# Patient Record
Sex: Female | Born: 1970 | Race: White | Hispanic: No | Marital: Married | Smoking: Former smoker
Health system: Southern US, Community
[De-identification: ages and names within clinical notes are randomized; demographics above are authoritative.]

## PROBLEM LIST (undated history)

## (undated) DIAGNOSIS — F32A Depression, unspecified: Secondary | ICD-10-CM

## (undated) DIAGNOSIS — C4490 Unspecified malignant neoplasm of skin, unspecified: Secondary | ICD-10-CM

## (undated) DIAGNOSIS — F419 Anxiety disorder, unspecified: Secondary | ICD-10-CM

## (undated) DIAGNOSIS — C50919 Malignant neoplasm of unspecified site of unspecified female breast: Secondary | ICD-10-CM

## (undated) DIAGNOSIS — Z803 Family history of malignant neoplasm of breast: Secondary | ICD-10-CM

## (undated) DIAGNOSIS — Z923 Personal history of irradiation: Secondary | ICD-10-CM

## (undated) DIAGNOSIS — J45909 Unspecified asthma, uncomplicated: Secondary | ICD-10-CM

## (undated) DIAGNOSIS — M1990 Unspecified osteoarthritis, unspecified site: Secondary | ICD-10-CM

## (undated) DIAGNOSIS — Z801 Family history of malignant neoplasm of trachea, bronchus and lung: Secondary | ICD-10-CM

## (undated) DIAGNOSIS — Z8041 Family history of malignant neoplasm of ovary: Secondary | ICD-10-CM

## (undated) DIAGNOSIS — Z806 Family history of leukemia: Secondary | ICD-10-CM

## (undated) DIAGNOSIS — D649 Anemia, unspecified: Secondary | ICD-10-CM

## (undated) DIAGNOSIS — Z808 Family history of malignant neoplasm of other organs or systems: Secondary | ICD-10-CM

## (undated) HISTORY — PX: TUBAL LIGATION: SHX77

## (undated) HISTORY — DX: Anemia, unspecified: D64.9

## (undated) HISTORY — DX: Family history of malignant neoplasm of trachea, bronchus and lung: Z80.1

## (undated) HISTORY — PX: OTHER SURGICAL HISTORY: SHX169

## (undated) HISTORY — DX: Anxiety disorder, unspecified: F41.9

## (undated) HISTORY — DX: Family history of malignant neoplasm of breast: Z80.3

## (undated) HISTORY — DX: Family history of malignant neoplasm of other organs or systems: Z80.8

## (undated) HISTORY — DX: Family history of leukemia: Z80.6

## (undated) HISTORY — PX: COLONOSCOPY: SHX174

## (undated) HISTORY — DX: Family history of malignant neoplasm of ovary: Z80.41

---

## 1999-07-17 HISTORY — PX: LUMBAR MICRODISCECTOMY: SHX99

## 2003-04-23 ENCOUNTER — Encounter: Admission: RE | Admit: 2003-04-23 | Discharge: 2003-04-23 | Payer: Self-pay | Admitting: Family Medicine

## 2003-04-23 ENCOUNTER — Encounter: Payer: Self-pay | Admitting: Family Medicine

## 2003-06-28 ENCOUNTER — Encounter: Admission: RE | Admit: 2003-06-28 | Discharge: 2003-06-28 | Payer: Self-pay | Admitting: Family Medicine

## 2003-12-23 ENCOUNTER — Other Ambulatory Visit: Admission: RE | Admit: 2003-12-23 | Discharge: 2003-12-23 | Payer: Self-pay | Admitting: *Deleted

## 2004-06-27 ENCOUNTER — Emergency Department (HOSPITAL_COMMUNITY): Admission: EM | Admit: 2004-06-27 | Discharge: 2004-06-27 | Payer: Self-pay | Admitting: Emergency Medicine

## 2006-01-02 ENCOUNTER — Other Ambulatory Visit: Admission: RE | Admit: 2006-01-02 | Discharge: 2006-01-02 | Payer: Self-pay | Admitting: Obstetrics and Gynecology

## 2006-07-28 ENCOUNTER — Inpatient Hospital Stay (HOSPITAL_COMMUNITY): Admission: AD | Admit: 2006-07-28 | Discharge: 2006-07-28 | Payer: Self-pay | Admitting: Obstetrics and Gynecology

## 2006-07-30 ENCOUNTER — Inpatient Hospital Stay (HOSPITAL_COMMUNITY): Admission: AD | Admit: 2006-07-30 | Discharge: 2006-08-03 | Payer: Self-pay | Admitting: Obstetrics and Gynecology

## 2006-07-31 ENCOUNTER — Encounter (INDEPENDENT_AMBULATORY_CARE_PROVIDER_SITE_OTHER): Payer: Self-pay | Admitting: *Deleted

## 2007-05-27 ENCOUNTER — Other Ambulatory Visit: Admission: RE | Admit: 2007-05-27 | Discharge: 2007-05-27 | Payer: Self-pay | Admitting: Obstetrics and Gynecology

## 2008-06-02 ENCOUNTER — Other Ambulatory Visit: Admission: RE | Admit: 2008-06-02 | Discharge: 2008-06-02 | Payer: Self-pay | Admitting: Obstetrics and Gynecology

## 2008-07-13 ENCOUNTER — Inpatient Hospital Stay (HOSPITAL_COMMUNITY): Admission: AD | Admit: 2008-07-13 | Discharge: 2008-07-13 | Payer: Self-pay | Admitting: Obstetrics and Gynecology

## 2008-07-15 ENCOUNTER — Inpatient Hospital Stay (HOSPITAL_COMMUNITY): Admission: AD | Admit: 2008-07-15 | Discharge: 2008-07-15 | Payer: Self-pay | Admitting: Obstetrics and Gynecology

## 2008-07-19 ENCOUNTER — Inpatient Hospital Stay (HOSPITAL_COMMUNITY): Admission: AD | Admit: 2008-07-19 | Discharge: 2008-07-19 | Payer: Self-pay | Admitting: Obstetrics and Gynecology

## 2009-06-08 ENCOUNTER — Ambulatory Visit (HOSPITAL_COMMUNITY): Admission: RE | Admit: 2009-06-08 | Discharge: 2009-06-08 | Payer: Self-pay | Admitting: Obstetrics and Gynecology

## 2009-07-18 ENCOUNTER — Encounter (INDEPENDENT_AMBULATORY_CARE_PROVIDER_SITE_OTHER): Payer: Self-pay | Admitting: Obstetrics and Gynecology

## 2009-07-18 ENCOUNTER — Inpatient Hospital Stay (HOSPITAL_COMMUNITY): Admission: RE | Admit: 2009-07-18 | Discharge: 2009-07-20 | Payer: Self-pay | Admitting: Obstetrics and Gynecology

## 2009-10-25 ENCOUNTER — Other Ambulatory Visit: Admission: RE | Admit: 2009-10-25 | Discharge: 2009-10-25 | Payer: Self-pay | Admitting: Obstetrics and Gynecology

## 2010-10-01 LAB — GLUCOSE, CAPILLARY
Glucose-Capillary: 69 mg/dL — ABNORMAL LOW (ref 70–99)
Glucose-Capillary: 75 mg/dL (ref 70–99)

## 2010-10-01 LAB — URINALYSIS, ROUTINE W REFLEX MICROSCOPIC
Glucose, UA: NEGATIVE mg/dL
Hgb urine dipstick: NEGATIVE
Protein, ur: NEGATIVE mg/dL

## 2010-10-01 LAB — CBC
HCT: 23.8 % — ABNORMAL LOW (ref 36.0–46.0)
HCT: 31.3 % — ABNORMAL LOW (ref 36.0–46.0)
Hemoglobin: 10.2 g/dL — ABNORMAL LOW (ref 12.0–15.0)
MCHC: 32.7 g/dL (ref 30.0–36.0)
Platelets: 176 10*3/uL (ref 150–400)
RDW: 18.7 % — ABNORMAL HIGH (ref 11.5–15.5)
WBC: 8.4 10*3/uL (ref 4.0–10.5)

## 2010-10-01 LAB — CCBB MATERNAL DONOR DRAW

## 2010-10-30 LAB — HCG, QUANTITATIVE, PREGNANCY: hCG, Beta Chain, Quant, S: 18 m[IU]/mL — ABNORMAL HIGH (ref <5)

## 2010-12-01 NOTE — Op Note (Signed)
NA[place]land Kitchen  [place], [place]           ACCOUNT NO.:  [id number]   MEDICAL RECORD NO.:  [id number]          PATIENT TYPE:  OBV   LOCATION:  9163                          FACILITY:  [facility]   PHYSICIAN:  Gerald Leitz, MD          DATE OF BIRTH:  Dec 26, 1970   DATE OF PROCEDURE:  07/31/2006  DATE OF DISCHARGE:                               OPERATIVE REPORT   PREOPERATIVE DIAGNOSIS:  A 40-2/7-week intrauterine pregnancy with  failure to progress and fetal tachycardia.   POSTOPERATIVE DIAGNOSIS:  A 40-2/7-week intrauterine pregnancy with  failure to progress and fetal tachycardia.   PROCEDURE:  Primary low transverse cesarean section.   SURGEON:  Gerald Leitz, MD   ASSISTANT:  None.   ANESTHESIA:  Epidural.   SPECIMENS:  Placenta.   DISPOSITION OF SPECIMEN:  Sent to pathology.   ESTIMATED BLOOD LOSS:  700 mL.   FLUIDS:  2100 mL of lactated Ringer's.   URINE OUTPUT:  100 mL.   COMPLICATIONS:  None.   FINDINGS:  Female infant in cephalic presentation with Apgars of 8 and 9  at 1 and 5 minutes, respectively, arterial cord pH of 7.27, weight 7  pounds 9 ounces.   INDICATIONS:  This is a G1, P0 at 40-2/7-week intrauterine pregnancy  with failure to progress with maximal cervical dilatation of 8 cm and  fetal tachycardia.   PROCEDURE:  Risks, benefits and alternatives of the procedure were  explained to the patient.  Informed consent was obtained.  The patient  was taken to the operating room, where her epidural anesthesia was found  to be adequate.  She was prepped and draped in the usual sterile  fashion.  A Pfannenstiel skin incision was made with the scalpel and it  was carried down to the underlying layer of fascia, which was incised in  the midline.  The incision was extended laterally with Mayo scissors.  The superior aspect of the fascial incision was grasped with Kocher  clamps, elevated and the underlying rectus muscles were dissected off  with Mayo scissors and this was  repeated on the inferior aspect of the  fascial incision.  The rectus muscles were separated in the midline.  The peritoneum was identified and entered bluntly.  Bladder blade was  inserted.  The vesicouterine peritoneum was identified, tented up and  entered sharply with Metzenbaum scissors.  The incision was extended  laterally.  The bladder flap was created digitally.  The bladder blade  was then replaced.  A low-transverse uterine incision was made with a  scalpel.  The infant's head was delivered; the mouth and nose were bulb  suctioned.  The anterior shoulder and body was delivered.  Cord was  clamped x2 and cut.  The infant was handed off to the awaiting  neonatologist.  Placenta was expressed.  The uterus was exteriorized and  cleared of all clot and debris.  The uterine incision closed with 0  Vicryl in a running-locked fashion; a second layer of the same suture  was used for excellent hemostasis.  The uterus was returned to the  abdomen.  The abdomen  was copiously irrigated.  The uterine incision was  found to be hemostatic.  The fascia was reapproximated with 0 PDS.  Scarpa's fascia was reapproximated with interrupted 2-0 Vicryl.  The  skin was closed with staples.  Sponge, lap and needle counts were  correct x2.  Two grams of Ancef were given at cord clamp.  The patient  was transferred to the recovery room, awake and in stable condition.      Gerald Leitz, MD  Electronically Signed     TC/MEDQ  D:  07/31/2006  T:  07/31/2006  Job:  [id number]

## 2010-12-01 NOTE — Discharge Summary (Signed)
[name], [place]           ACCOUNT NO.:  [id number]   MEDICAL RECORD NO.:  [id number]          PATIENT TYPE:  INP   LOCATION:  9113                          FACILITY:  [facility]   PHYSICIAN:  Gerald Leitz, MD          DATE OF BIRTH:  Oct 05, 1970   DATE OF ADMISSION:  07/29/2006  DATE OF DISCHARGE:  08/03/2006                               DISCHARGE SUMMARY   ADMISSION DIAGNOSES:  1. Forty week intrauterine pregnancy.  2. Active labor.  3. Asthma.   DISCHARGE DIAGNOSES:  1. Forty week and one day intrauterine pregnancy.  2. Active labor.  3. Asthma.  4. Failure to progress.  5. Anemia.   BRIEF HOSPITAL COURSE:  The patient was admitted on July 29, 2006,  for observation.  She was found to be in active labor and formally  admitted on July 30, 2006.  She received Pitocin for augmentation of  labor, achieved a maximal cervical dilatation of 8 cm, developed failure  to progress.  She underwent low transverse cesarean section on July 31, 2006.  She delivered a liveborn female infant, cephalic  presentation, with Apgar's of 8 and 9 at one and five minutes  respectively.  Arterial cord pH was 7.27.  Weight was 7 pounds, 9  ounces.  The patient did well postoperatively.  Hemoglobin on postop day  #1 was 8.2, hematocrit of 24.4.  She received routine postoperative  care, discharged home on August 02, 2006.   CONDITION AT DISCHARGE:  Stable and improved.   FOLLOWUP:  Dr. Richardson Dopp on January 21st for staple removal, and in four to  six weeks for postoperative visit.   MEDICATIONS AT DISCHARGE:  Percocet and Motrin as well as iron sulfate.   ACTIVITY:  Ad lib, pelvic rest for six weeks, no heavy lifting for four  weeks, no driving for two weeks.      Gerald Leitz, MD  Electronically Signed     TC/MEDQ  D:  08/13/2006  T:  08/13/2006  Job:  [id number]

## 2011-04-20 LAB — HCG, QUANTITATIVE, PREGNANCY
hCG, Beta Chain, Quant, S: 133 m[IU]/mL — ABNORMAL HIGH (ref <5)
hCG, Beta Chain, Quant, S: 69 m[IU]/mL — ABNORMAL HIGH (ref <5)

## 2011-04-20 LAB — CBC
HCT: 31.9 % — ABNORMAL LOW (ref 36.0–46.0)
Platelets: 280 10*3/uL (ref 150–400)
WBC: 8.5 10*3/uL (ref 4.0–10.5)

## 2011-04-20 LAB — ABO/RH: ABO/RH(D): A POS

## 2011-05-18 ENCOUNTER — Other Ambulatory Visit: Payer: Self-pay

## 2011-05-18 ENCOUNTER — Other Ambulatory Visit (HOSPITAL_COMMUNITY)
Admission: RE | Admit: 2011-05-18 | Discharge: 2011-05-18 | Disposition: A | Discharge status: Home / Self Care | Payer: BC Managed Care – PPO | Source: Ambulatory Visit | Attending: Family Medicine | Admitting: Family Medicine

## 2011-05-18 DIAGNOSIS — Z01419 Encounter for gynecological examination (general) (routine) without abnormal findings: Secondary | ICD-10-CM | POA: Insufficient documentation

## 2011-11-02 ENCOUNTER — Telehealth: Payer: Self-pay | Admitting: Oncology

## 2011-11-02 NOTE — Telephone Encounter (Signed)
S/w pt today re appt for 4/30 @ 10:30 am w/FS. Date per pt due to out of town.

## 2011-11-13 ENCOUNTER — Ambulatory Visit: Payer: BC Managed Care – PPO | Admitting: Oncology

## 2011-11-13 ENCOUNTER — Ambulatory Visit: Payer: BC Managed Care – PPO

## 2011-11-13 ENCOUNTER — Other Ambulatory Visit: Payer: BC Managed Care – PPO | Admitting: Lab

## 2011-11-13 ENCOUNTER — Telehealth: Payer: Self-pay | Admitting: Oncology

## 2011-11-13 ENCOUNTER — Other Ambulatory Visit: Payer: Self-pay | Admitting: Oncology

## 2011-11-13 DIAGNOSIS — D649 Anemia, unspecified: Secondary | ICD-10-CM

## 2011-11-13 NOTE — Telephone Encounter (Signed)
Returned pt's call re r/s 4/30 appt. Pt given new appt for 5/8.

## 2011-11-21 ENCOUNTER — Other Ambulatory Visit: Payer: Self-pay | Admitting: *Deleted

## 2011-11-21 ENCOUNTER — Encounter: Payer: Self-pay | Admitting: Oncology

## 2011-11-21 ENCOUNTER — Ambulatory Visit (HOSPITAL_BASED_OUTPATIENT_CLINIC_OR_DEPARTMENT_OTHER): Payer: BC Managed Care – PPO | Admitting: Oncology

## 2011-11-21 ENCOUNTER — Ambulatory Visit: Payer: BC Managed Care – PPO

## 2011-11-21 ENCOUNTER — Telehealth: Payer: Self-pay | Admitting: Oncology

## 2011-11-21 ENCOUNTER — Other Ambulatory Visit (HOSPITAL_BASED_OUTPATIENT_CLINIC_OR_DEPARTMENT_OTHER): Payer: BC Managed Care – PPO | Admitting: Lab

## 2011-11-21 VITALS — BP 115/61 | HR 66 | Temp 97.7°F | Ht 62.25 in | Wt 150.4 lb

## 2011-11-21 DIAGNOSIS — D649 Anemia, unspecified: Secondary | ICD-10-CM

## 2011-11-21 DIAGNOSIS — R42 Dizziness and giddiness: Secondary | ICD-10-CM

## 2011-11-21 LAB — CBC WITH DIFFERENTIAL/PLATELET
BASO%: 0.8 % (ref 0.0–2.0)
EOS%: 4.3 % (ref 0.0–7.0)
MCHC: 31.1 g/dL — ABNORMAL LOW (ref 31.5–36.0)
MONO#: 0.2 10*3/uL (ref 0.1–0.9)
RBC: 4.12 10*6/uL (ref 3.70–5.45)
RDW: 15.3 % — ABNORMAL HIGH (ref 11.2–14.5)
WBC: 5.2 10*3/uL (ref 3.9–10.3)
lymph#: 1.5 10*3/uL (ref 0.9–3.3)

## 2011-11-21 LAB — COMPREHENSIVE METABOLIC PANEL
ALT: 10 U/L (ref 0–35)
AST: 17 U/L (ref 0–37)
CO2: 27 mEq/L (ref 19–32)
Calcium: 9 mg/dL (ref 8.4–10.5)
Chloride: 105 mEq/L (ref 96–112)
Sodium: 140 mEq/L (ref 135–145)
Total Protein: 6.5 g/dL (ref 6.0–8.3)

## 2011-11-21 LAB — CHCC SMEAR

## 2011-11-21 LAB — IRON AND TIBC: %SAT: 3 % — ABNORMAL LOW (ref 20–55)

## 2011-11-21 NOTE — Telephone Encounter (Signed)
Gv pt appt for sept2013 

## 2011-11-21 NOTE — Progress Notes (Signed)
Referral MD  Dr. Cain Saupe   Reason for Referral: Anemia   HPI: 41 year old native of [place] and lives in [place]. 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 She has a past medical history significant for depression and long-standing anemia. She has been told that she is anemic since her teenage days where she started having menstrual cycles. She had been on iron replacement in the past that have been quite erratic in taking. Her hemoglobin usually fluctuates between 10 and 11 however it did drop down to 7.7 during her most recent pregnancy 2 years ago. Most recently she developed an episode of acute bronchitis as well as a dizzy spell and was evaluated by her primary care physician and found to have anemia with a hemoglobin of 9.9. Her MCV was 78.4. She had normal differential and normal platelet count. That was done on April 12 and have been trying to take oral iron replacement at this time. She had some difficulty adhering to the oral regimens as well as some occasional drips of dyspepsia associated with it. She still have heavy menstrual cycles at times lasting 3-4 days. She had not reported any GI bleeding had not reported any GU bleeding had not reported any epistaxis or any blood loss anywhere else. Her energy is relatively stable she does report some mild fatigue however. She does report occasional vertigo episodes associated with positional changes.   PAST MEDICAL HISTORY: Her past medical history is significant for anemia as well as anxiety and history of an ovarian cyst     Current outpatient prescriptions:ferrous sulfate 325 (65 FE) MG tablet, Take 325 mg by mouth 2 (two) times daily., Disp: , Rfl: ;  FLUoxetine (PROZAC) 20 MG capsule, Take 20 mg by mouth Daily., Disp: , Rfl: :    :  No Known Allergies:   Family history: Her mother has history of hypertension and her father has a history of hyperlipidemia. She has 3  sisters and then just has diagnosis of anemia.          Social history: She smokes about 1 pack a week and drinks about 2-3 beers a day. She works as a Furniture conservator/restorer she currently married and lives in [place].   REVIEW OF SYSTEMS:  Constitutional: negative Eyes: negative Ears, nose, mouth, throat, and face: negative Respiratory: negative Cardiovascular: negative Genitourinary:negative Hematologic/lymphatic: negative  rest or view of system is unremarkable    PHYSICAL EXAM: BP: 115/61. P 66 RR 16 Temp 97.7 Weight 150 lb. ECOG: 0 General appearance: alert Head: Normocephalic, without obvious abnormality, atraumatic Eyes: conjunctivae/corneas clear. PERRL, EOM's intact. Fundi benign. Nose: Nares normal. Septum midline. Mucosa normal. No drainage or sinus tenderness. Throat: lips, mucosa, and tongue normal; teeth and gums normal Neck: no adenopathy, no carotid bruit, no JVD, supple, symmetrical, trachea midline and thyroid not enlarged, symmetric, no tenderness/mass/nodules Chest wall: no tenderness Cardio: regular rate and rhythm, S1, S2 normal, no murmur, click, rub or gallop Pelvic: deferred Extremities: extremities normal, atraumatic, no cyanosis or edema Skin: Skin color, texture, turgor normal. No rashes or lesions   Component Value Date   WBC 5.2 11/21/2011   HGB 10.2* 11/21/2011   HCT 32.8* 11/21/2011   MCV 79.6 11/21/2011   PLT 315 11/21/2011     Blood smear review: Red cells appeared microcytic in nature aswell as hyperchromia was noted. There is no schistocytosis or red cell fragmentation is no polychromasia  or  Spherocytosis. No other abnormalities in the white cells or platelets.   Assessment and Plan:   41 year old with:   1. Anemia: microcytic and hypochromic. This is likely related to iron deficiency due to menstrual blood losses.  She has been erratic with her po iron replacement. I have given her the option of IV iron replacement in the  form of feraheme vs continuing  Strict  adherence po iron replacement. She would like to defer IV iron for now.  Other possibility to her anemia would include hemoglobinopathy such as sickle cell or thalassemia I think it's unlikely at this point given the finding on her peripheral smear.  The plan will be at this point to give her 3-4 months of by mouth iron replacement and if it's not successful we will try IV iron.  2. Vertigo and dizziness: Etiology is a likely related to her anemia. I feel the degree of anemia here is very mild and is chronic in nature I doubt that this is a contributing factor. Other factors such as include benign positional vertigo or in her ear problems are more likely. I recommended an ENT evaluation regarding. All her questions are answered today followup as mentioned will be in 4 months time

## 2012-03-25 ENCOUNTER — Ambulatory Visit: Payer: BC Managed Care – PPO | Admitting: Oncology

## 2012-03-25 ENCOUNTER — Other Ambulatory Visit: Payer: BC Managed Care – PPO | Admitting: Lab

## 2012-10-20 ENCOUNTER — Other Ambulatory Visit: Payer: Self-pay | Admitting: Family Medicine

## 2012-10-20 ENCOUNTER — Other Ambulatory Visit (HOSPITAL_COMMUNITY)
Admission: RE | Admit: 2012-10-20 | Discharge: 2012-10-20 | Disposition: A | Discharge status: Home / Self Care | Payer: BC Managed Care – PPO | Source: Ambulatory Visit | Attending: Family Medicine | Admitting: Family Medicine

## 2012-10-20 DIAGNOSIS — Z0000 Encounter for general adult medical examination without abnormal findings: Secondary | ICD-10-CM | POA: Insufficient documentation

## 2012-10-21 ENCOUNTER — Other Ambulatory Visit: Payer: Self-pay | Admitting: Family Medicine

## 2012-10-21 DIAGNOSIS — Z1231 Encounter for screening mammogram for malignant neoplasm of breast: Secondary | ICD-10-CM

## 2012-11-19 ENCOUNTER — Ambulatory Visit
Admission: RE | Admit: 2012-11-19 | Discharge: 2012-11-19 | Disposition: A | Discharge status: Home / Self Care | Payer: BC Managed Care – PPO | Source: Ambulatory Visit | Attending: Family Medicine | Admitting: Family Medicine

## 2012-11-19 ENCOUNTER — Other Ambulatory Visit: Payer: Self-pay | Admitting: Family Medicine

## 2012-11-19 DIAGNOSIS — Z1231 Encounter for screening mammogram for malignant neoplasm of breast: Secondary | ICD-10-CM

## 2012-11-19 DIAGNOSIS — R928 Other abnormal and inconclusive findings on diagnostic imaging of breast: Secondary | ICD-10-CM

## 2012-12-04 ENCOUNTER — Ambulatory Visit
Admission: RE | Admit: 2012-12-04 | Discharge: 2012-12-04 | Disposition: A | Discharge status: Home / Self Care | Payer: BC Managed Care – PPO | Source: Ambulatory Visit | Attending: Family Medicine | Admitting: Family Medicine

## 2012-12-04 DIAGNOSIS — R928 Other abnormal and inconclusive findings on diagnostic imaging of breast: Secondary | ICD-10-CM

## 2014-04-02 ENCOUNTER — Other Ambulatory Visit: Payer: Self-pay | Admitting: Family Medicine

## 2014-04-02 ENCOUNTER — Other Ambulatory Visit (HOSPITAL_COMMUNITY)
Admission: RE | Admit: 2014-04-02 | Discharge: 2014-04-02 | Disposition: A | Discharge status: Home / Self Care | Payer: BC Managed Care – PPO | Source: Ambulatory Visit | Attending: Family Medicine | Admitting: Family Medicine

## 2014-04-02 DIAGNOSIS — Z0000 Encounter for general adult medical examination without abnormal findings: Secondary | ICD-10-CM | POA: Insufficient documentation

## 2014-04-06 LAB — CYTOLOGY - PAP

## 2016-02-10 ENCOUNTER — Other Ambulatory Visit: Payer: Self-pay | Admitting: Family Medicine

## 2016-02-10 DIAGNOSIS — Z1231 Encounter for screening mammogram for malignant neoplasm of breast: Secondary | ICD-10-CM

## 2016-02-21 ENCOUNTER — Ambulatory Visit
Admission: RE | Admit: 2016-02-21 | Discharge: 2016-02-21 | Disposition: A | Discharge status: Home / Self Care | Payer: BC Managed Care – PPO | Source: Ambulatory Visit | Attending: Family Medicine | Admitting: Family Medicine

## 2016-02-21 DIAGNOSIS — Z1231 Encounter for screening mammogram for malignant neoplasm of breast: Secondary | ICD-10-CM

## 2016-02-22 ENCOUNTER — Other Ambulatory Visit: Payer: Self-pay | Admitting: Family Medicine

## 2016-02-22 DIAGNOSIS — R928 Other abnormal and inconclusive findings on diagnostic imaging of breast: Secondary | ICD-10-CM

## 2016-02-27 ENCOUNTER — Ambulatory Visit
Admission: RE | Admit: 2016-02-27 | Discharge: 2016-02-27 | Disposition: A | Discharge status: Home / Self Care | Payer: BC Managed Care – PPO | Source: Ambulatory Visit | Attending: Family Medicine | Admitting: Family Medicine

## 2016-02-27 DIAGNOSIS — R928 Other abnormal and inconclusive findings on diagnostic imaging of breast: Secondary | ICD-10-CM

## 2016-04-06 ENCOUNTER — Other Ambulatory Visit: Payer: Self-pay | Admitting: Family Medicine

## 2016-04-06 ENCOUNTER — Other Ambulatory Visit (HOSPITAL_COMMUNITY)
Admission: RE | Admit: 2016-04-06 | Discharge: 2016-04-06 | Disposition: A | Discharge status: Home / Self Care | Payer: BC Managed Care – PPO | Source: Ambulatory Visit | Attending: Family Medicine | Admitting: Family Medicine

## 2016-04-06 DIAGNOSIS — Z01419 Encounter for gynecological examination (general) (routine) without abnormal findings: Secondary | ICD-10-CM | POA: Insufficient documentation

## 2016-04-10 LAB — CYTOLOGY - PAP

## 2017-05-28 ENCOUNTER — Other Ambulatory Visit: Payer: Self-pay | Admitting: Physician Assistant

## 2017-05-28 DIAGNOSIS — Z139 Encounter for screening, unspecified: Secondary | ICD-10-CM

## 2017-07-16 HISTORY — PX: BREAST LUMPECTOMY: SHX2

## 2017-07-16 HISTORY — PX: BREAST BIOPSY: SHX20

## 2018-01-29 ENCOUNTER — Ambulatory Visit
Admission: RE | Admit: 2018-01-29 | Discharge: 2018-01-29 | Disposition: A | Discharge status: Home / Self Care | Payer: BC Managed Care – PPO | Source: Ambulatory Visit | Attending: Physician Assistant | Admitting: Physician Assistant

## 2018-01-29 DIAGNOSIS — Z139 Encounter for screening, unspecified: Secondary | ICD-10-CM

## 2018-01-30 ENCOUNTER — Other Ambulatory Visit: Payer: Self-pay | Admitting: Physician Assistant

## 2018-01-30 DIAGNOSIS — R921 Mammographic calcification found on diagnostic imaging of breast: Secondary | ICD-10-CM

## 2018-02-04 ENCOUNTER — Other Ambulatory Visit: Payer: Self-pay | Admitting: Physician Assistant

## 2018-02-04 ENCOUNTER — Ambulatory Visit
Admission: RE | Admit: 2018-02-04 | Discharge: 2018-02-04 | Disposition: A | Discharge status: Home / Self Care | Payer: BC Managed Care – PPO | Source: Ambulatory Visit | Attending: Physician Assistant | Admitting: Physician Assistant

## 2018-02-04 DIAGNOSIS — R921 Mammographic calcification found on diagnostic imaging of breast: Secondary | ICD-10-CM

## 2018-02-07 ENCOUNTER — Ambulatory Visit
Admission: RE | Admit: 2018-02-07 | Discharge: 2018-02-07 | Disposition: A | Discharge status: Home / Self Care | Payer: BC Managed Care – PPO | Source: Ambulatory Visit | Attending: Physician Assistant | Admitting: Physician Assistant

## 2018-02-07 DIAGNOSIS — R921 Mammographic calcification found on diagnostic imaging of breast: Secondary | ICD-10-CM

## 2018-02-12 ENCOUNTER — Telehealth: Payer: Self-pay | Admitting: Oncology

## 2018-02-12 ENCOUNTER — Encounter: Payer: Self-pay | Admitting: *Deleted

## 2018-02-12 DIAGNOSIS — D0511 Intraductal carcinoma in situ of right breast: Secondary | ICD-10-CM

## 2018-02-12 NOTE — Telephone Encounter (Signed)
[facility] emailed to [facility]

## 2018-02-25 NOTE — Progress Notes (Signed)
Radiation Oncology         [phone] ________________________________  Breast Clinic Initial outpatient Consultation  Name: Ann Patton MRN: [id number]  Date: 02/26/2018  DOB: 10/06/1970  WU:[id number]:Nelson, Ann AcreKristen M, PA-C  Glenna FellowsHoxworth, Benjamin, MD   REFERRING PHYSICIAN: Glenna FellowsHoxworth, Benjamin, MD  DIAGNOSIS:    ICD-10-CM   1. Ductal carcinoma in situ (DCIS) of right breast D05.11   Cancer Staging Ductal carcinoma in situ (DCIS) of right breast Staging form: Breast, AJCC 8th Edition - Clinical stage from 02/26/2018: Stage 0 (cTis (DCIS), cN0, cM0, ER+, PR+) - Unsigned High Grade DCIS with necrosis, right breast, ER+ PR+  CHIEF COMPLAINT: Here to discuss management of right breast DCIS  HISTORY OF PRESENT ILLNESS::[name] is a 47 y.o. female who presented with right breast calcifications on mammogram.  She had a routine screening mammography on 01/29/18 showing calcifications in the right breast. She underwent bilateral diagnostic mammography with tomography and right breast ultrasonography at [facility] on 02/04/18 confirming  Suspicious right breast calcifications 14mm in span.   Accordingly on 02/07/18 she proceeded to right breast needle core biopsy with pathology from this procedure showing: mammary carcinoma in situ, high grade with necrosis; negative for invasive carcinoma. Prognostic indicators significant for: ER, 95% positive with strong staining intensity, and PR, 80% positive with moderate staining intensity.   On review of systems, patient notes fatigue and borderline anemia.  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 Patient denies and any other symptoms.   PREVIOUS RADIATION THERAPY: No  PAST MEDICAL HISTORY:  has a past medical history of Anemia and Anxiety.    PAST SURGICAL HISTORY:History reviewed. No pertinent surgical history.  FAMILY HISTORY: family history includes Breast cancer in her paternal grandmother; Hypertension in her  mother; Lung cancer in her mother; Ovarian cancer in her mother; Skin cancer in her father. Ovarian cancer in mother  SOCIAL HISTORY:  reports that she has been smoking cigarettes. She has been smoking about 0.25 packs per day. She does not have any smokeless tobacco history on file. She reports that she drinks about 2.0 standard drinks of alcohol per week. She reports that she does not use drugs.  ALLERGIES: Patient has no known allergies.  MEDICATIONS:  Current Outpatient Medications  Medication Sig Dispense Refill  . escitalopram (LEXAPRO) 10 MG tablet Take 10 mg by mouth daily.    . ferrous sulfate 325 (65 FE) MG tablet Take 325 mg by mouth 2 (two) times daily.    . Multiple Vitamin (MULTIVITAMIN) tablet Take 1 tablet by mouth daily.    . Probiotic Product (PROBIOTIC ADVANCED PO) Take 1 tablet by mouth.    . zolpidem (AMBIEN) 10 MG tablet Take 10 mg by mouth at bedtime as needed for sleep.     No current facility-administered medications for this encounter.     REVIEW OF SYSTEMS: A 10+ POINT REVIEW OF SYSTEMS WAS OBTAINED including neurology, dermatology, psychiatry, cardiac, respiratory, lymph, extremities, GI, GU, Musculoskeletal, constitutional, breasts, reproductive, HEENT.  All pertinent positives are noted in the HPI.  All others are negative.   PHYSICAL EXAM:  Vitals - 1 value per visit 02/26/2018  SYSTOLIC 116  DIASTOLIC 51  Pulse 72  Temperature 98.6  Respirations 18  Weight (lb) 140.5  Height 5' 2.25"  BMI 25.49  VISIT REPORT    General: Alert and oriented, in no acute distress HEENT: Head is normocephalic. Extraocular movements are intact. Oropharynx is clear. Neck: Neck is supple, no palpable  cervical or supraclavicular lymphadenopathy. Heart: Regular in rate and rhythm with no murmurs, rubs, or gallops. Chest: Clear to auscultation bilaterally, with no rhonchi, wheezes, or rales. Abdomen: Soft, nontender, nondistended, with no rigidity or guarding. Extremities:  No cyanosis or edema. Lymphatics: see Neck Exam Skin: No concerning lesions. Musculoskeletal: symmetric strength and muscle tone throughout. Neurologic: Cranial nerves II through XII are grossly intact. No obvious focalities. Speech is fluent. Coordination is intact. Psychiatric: Judgment and insight are intact. Affect is appropriate. Breasts: faint post biopsy changes in UIQ of right breast . No other palpable masses appreciated in the breasts or axillae .    ECOG = 0  0 - Asymptomatic (Fully active, able to carry on all predisease activities without restriction)  1 - Symptomatic but completely ambulatory (Restricted in physically strenuous activity but ambulatory and able to carry out work of a light or sedentary nature. For example, light housework, office work)  2 - Symptomatic, <50% in bed during the day (Ambulatory and capable of all self care but unable to carry out any work activities. Up and about more than 50% of waking hours)  3 - Symptomatic, >50% in bed, but not bedbound (Capable of only limited self-care, confined to bed or chair 50% or more of waking hours)  4 - Bedbound (Completely disabled. Cannot carry on any self-care. Totally confined to bed or chair)  5 - Death   Santiago Gladken MM, Creech RH, Tormey DC, et al. [phone](1982). "Toxicity and response criteria of the [facility] Cooperative Oncology Group". Am. Evlyn ClinesJ. Clin. Oncol. 5 (6): 649-55   LABORATORY DATA:  Lab Results  Component Value Date   WBC 4.3 02/26/2018   HGB 9.9 (L) 02/26/2018   HCT 32.7 (L) 02/26/2018   MCV 79.6 02/26/2018   PLT 308 02/26/2018   CMP     Component Value Date/Time   NA 140 02/26/2018 0814   K 4.3 02/26/2018 0814   CL 107 02/26/2018 0814   CO2 23 02/26/2018 0814   GLUCOSE 90 02/26/2018 0814   BUN 9 02/26/2018 0814   CREATININE 0.87 02/26/2018 0814   CALCIUM 8.6 (L) 02/26/2018 0814   PROT 6.9 02/26/2018 0814   ALBUMIN 4.0 02/26/2018 0814   AST 20 02/26/2018 0814   ALT 15 02/26/2018 0814    ALKPHOS 36 (L) 02/26/2018 0814   BILITOT <0.2 (L) 02/26/2018 0814   GFRNONAA >60 02/26/2018 0814   GFRAA >60 02/26/2018 0814         RADIOGRAPHY: Mm Digital Diagnostic Unilat R  Result Date: 02/04/2018 CLINICAL DATA:  Patient returns after screening study for evaluation of RIGHT breast calcifications. EXAM: DIGITAL DIAGNOSTIC RIGHT MAMMOGRAM WITH CAD COMPARISON:  01/29/2018 and earlier ACR Breast Density Category c: The breast tissue is heterogeneously dense, which may obscure small masses. FINDINGS: Magnified views are performed of RIGHT breast calcifications. Within the UPPER INNER QUADRANT of the RIGHT breast there is a small group of linear and punctate calcifications measuring 1.4 x 1.0 x 0.4 centimeters. Mammographic images were processed with CAD. IMPRESSION: Indeterminate RIGHT breast calcifications for which biopsy is recommended. RECOMMENDATION: Stereotactic guided core biopsy of RIGHT breast calcifications, scheduled for the patient on 02/07/2018 I have discussed the findings and recommendations with the patient. Results were also provided in writing at the conclusion of the visit. If applicable, a reminder letter will be sent to the patient regarding the next appointment. BI-RADS CATEGORY  4: Suspicious. Electronically Signed   By: Norva PavlovElizabeth  Brown Patton.D.   On: 02/04/2018 09:31  Mm 3d Screen Breast Bilateral  Result Date: 01/29/2018 CLINICAL DATA:  Screening. EXAM: DIGITAL SCREENING BILATERAL MAMMOGRAM WITH TOMO AND CAD COMPARISON:  Previous exam(s). ACR Breast Density Category c: The breast tissue is heterogeneously dense, which may obscure small masses. FINDINGS: In the right breast, calcifications warrant further evaluation with magnified views. In the left breast, no findings suspicious for malignancy. Images were processed with CAD. IMPRESSION: Further evaluation is suggested for calcifications in the right breast. RECOMMENDATION: Diagnostic mammogram of the right breast.  (Code:[id number]) The patient will be contacted regarding the findings, and additional imaging will be scheduled. BI-RADS CATEGORY  0: Incomplete. Need additional imaging evaluation and/or prior mammograms for comparison. Electronically Signed   By: Amie Portland Patton.D.   On: 01/29/2018 16:15   Mm Clip Placement Right  Result Date: 02/07/2018 CLINICAL DATA:  Confirmation clip placement after stereotactic tomosynthesis core needle biopsy of indeterminate calcifications involving the UPPER INNER QUADRANT of the RIGHT breast at anterior depth. EXAM: DIAGNOSTIC RIGHT MAMMOGRAM POST STEREOTACTIC BIOPSY COMPARISON:  Previous exam(s). FINDINGS: Mammographic images were obtained following stereotactic tomosynthesis guided biopsy of calcifications involving the UPPER INNER QUADRANT of the RIGHT breast at anterior depth. The coil shaped tissue marker clip is appropriately positioned at the posterior margin of the biopsied group of calcifications. Expected post biopsy changes are present without evidence of hematoma. IMPRESSION: Appropriate positioning the coil shaped tissue marker clip at the posterior margin of the biopsied group of calcifications in the UPPER INNER QUADRANT of the RIGHT breast. Final Assessment: Post Procedure Mammograms for Marker Placement Electronically Signed   By: Hulan Saas Patton.D.   On: 02/07/2018 11:23   Mm Rt Breast Bx W Loc Dev 1st Lesion Image Bx Spec Stereo Guide  Addendum Date: 02/11/2018   ADDENDUM REPORT: 02/11/2018 14:16 ADDENDUM: Pathology revealed HIGH GRADE MAMMARY CARCINOMA IN SITU WITH NECROSIS of the Right breast, upper inner quadrant at anterior depth. The in-situ carcinoma is positive for E-cadherin, consistent with a ductal phenotype. This was found to be concordant by Dr. Quincy Carnes. Pathology results were discussed with the patient by telephone. The patient reported doing well after the biopsy with tenderness at the site. Post biopsy instructions and care were reviewed  and questions were answered. The patient was encouraged to call [facility] for any additional concerns. The patient was referred to [facility] on February 19, 2018. Pathology results reported by Rene Kocher, RN on 02/11/2018. Electronically Signed   By: Hulan Saas Patton.D.   On: 02/11/2018 14:16   Result Date: 02/11/2018 CLINICAL DATA:  Screening detected indeterminate approximate 1.5 cm group calcifications involving the UPPER INNER QUADRANT of the RIGHT breast at ANTERIOR depth. EXAM: RIGHT BREAST STEREOTACTIC CORE NEEDLE BIOPSY COMPARISON:  Previous exams. FINDINGS: The patient and I discussed the procedure of stereotactic-guided biopsy including benefits and alternatives. We discussed the high likelihood of a successful procedure. We discussed the risks of the procedure including infection, bleeding, tissue injury, clip migration, and inadequate sampling. Informed written consent was given. The usual time out protocol was performed immediately prior to the procedure. Using sterile technique with chlorhexidine as skin antisepsis, 1% lidocaine and 1% lidocaine with epinephrine as local anesthetic, under stereotactic guidance, a 9 gauge Brevera vacuum assisted device was used to perform core needle biopsy of calcifications involving the UPPER INNER QUADRANT of the RIGHT breast using a SUPERIOR approach. Specimen radiograph was performed showing calcifications in the core  samples. Specimens with calcifications are identified for pathology. Lesion quadrant: UPPER INNER QUADRANT. At the conclusion of the procedure, a coil shaped tissue marker clip was deployed into the biopsy cavity. Follow-up 2-view mammogram was performed and dictated separately. IMPRESSION: Stereotactic-guided biopsy of an approximate 1.5 cm group of indeterminate calcifications in the UPPER INNER QUADRANT the RIGHT breast at ANTERIOR depth.  The patient had a vasovagal episode immediately after the biopsy while obtaining the post clip diagnostic mammogram. This resolved spontaneously and no intervention was necessary. The patient's blood pressure was 139/73 and her pulse rate was 59 prior to discharge. Electronically Signed: By: Hulan Saas Patton.D. On: 02/07/2018 11:22      IMPRESSION/PLAN: DCIS, right breast  Plan for breast MRI, genetic testing, and lumpectomy  She has been discussed at our multidisciplinary tumor board.  The consensus is that she would be a good candidate for breast conservation. I talked to her about the option of a mastectomy and informed her that her expected overall survival would be equivalent between mastectomy and breast conservation, based upon randomized controlled data. She is enthusiastic about breast conservation if genetic testing is negative.  It was a pleasure meeting the patient today. We discussed the risks, benefits, and side effects of radiotherapy. I recommend radiotherapy to the right breast to reduce her risk of locoregional recurrence by half.  We discussed that radiation would take approximately 4 weeks to complete and that I would give the patient a few weeks to heal following surgery before starting treatment planning.   We spoke about acute effects including skin irritation and fatigue as well as much less common late effects including internal organ injury or irritation. We spoke about the latest technology that is used to minimize the risk of late effects for patients undergoing radiotherapy to the breast or chest wall. No guarantees of treatment were given. The patient is enthusiastic about proceeding with treatment.   She will be moving to [place] soon. The plan as of now is surgery here in [place], [place] and adjuvant therapies in [place], to be navigated by our nurse navigators.  I wished her the best.  __________________________________________   Lonie Peak, MD  This document  serves as a record of services personally performed by Lonie Peak, MD. It was created on his behalf by Mickie Bail, a trained medical scribe. The creation of this record is based on the scribe's personal observations and the provider's statements to them. This document has been checked and approved by the attending provider.

## 2018-02-25 NOTE — Progress Notes (Signed)
[facility]  [phone] [phone]     ID: Ann Patton DOB: 07-05-1971  MR#: [id number]  OZD#:[id number]  Patient Care Team: Kieth Brightly as PCP - General (Physician Assistant) Excell Seltzer, MD as Consulting Physician (General Surgery) Magrinat, Virgie Dad, MD as Consulting Physician (Oncology) Eppie Gibson, MD as Attending Physician (Radiation Oncology) OTHER MD:  CHIEF COMPLAINT: Estrogen receptor positive noninvasive breast cancer  CURRENT TREATMENT: Definitive surgery pending   HISTORY OF CURRENT ILLNESS: "Ann Memos" Patton had routine screening mammography on 01/29/2018 showing a possible abnormality in the right breast. She underwent bilateral diagnostic mammography with tomography and right breast ultrasonography at [facility] on 02/04/2018 showing: Breast density category C. Indeterminate RIGHT breast calcifications within UIQ, there is a small group of linear and punctate calcifications measuring 1.4 x 1.0 x 0.4 cm.   Accordingly on 02/07/2018 she proceeded to biopsy of the right breast area in question. The pathology from this procedure showed ([id number]): Breast, right, needle core biopsy, upper inner quadrant at anterior depth with mammary carcinoma in situ, high grade with necrosis. Negative for invasive carcinoma. E-cadherin positive. Prognostic indicators significant for: estrogen receptor, 95% positive with strong staining intensity and progesterone receptor, 80%.  The patient's subsequent history is as detailed below.  INTERVAL HISTORY: The patient was evaluated in the multidisciplinary breast cancer clinic on 02/26/2018 accompanied by her husband, Merry Proud and her mother, Collie Siad. Her case was also presented at the multidisciplinary breast cancer conference on the same day. At that time a preliminary plan was proposed: Breast conserving surgery, adjuvant radiation, antiestrogens, and genetics testing   REVIEW OF  SYSTEMS: Ann Patton reports that for exercise, she typically gets on the treadmill and HIIT workouts at [employer] on average 4-5 times a week prior to her diagnosis. She has a rescue inhaler due to being allergic to cats, however, she doesn't use it often. She denies skin biopsies. There were no specific symptoms leading to the original mammogram, which was routinely scheduled. The patient denies unusual headaches, visual changes, nausea, vomiting, stiff neck, dizziness, or gait imbalance. There has been no cough, phlegm production, or pleurisy, no chest pain or pressure, and no change in bowel or bladder habits. The patient denies fever, rash, bleeding, unexplained fatigue or unexplained weight loss. A detailed review of systems was otherwise entirely negative.   PAST MEDICAL HISTORY: Past Medical History:  Diagnosis Date  . Anemia   . Anxiety     PAST SURGICAL HISTORY: No past surgical history on file.  FAMILY HISTORY Family History  Problem Relation Age of Onset  . Hypertension Mother   . Ovarian cancer Mother   . Lung cancer Mother   . Skin cancer Father   . Breast cancer Paternal Grandmother   Her mom, Ann Patton, was my patient 11 years ago: She was diagnosed with lung cancer at age 66 and ovarian cancer at age 69..  The patient's father is  80 as of August 2019. The patient has no brothers and 3 sisters.   GYNECOLOGIC HISTORY:  She is having regular periods Menarche: 47 years old Age at first live birth: 47 years old [place] P2 LMP: February 15, 2018 Contraceptive: Oral contraceptives more than 10 years without complications.  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  Because of her job they are planning to move to [place] soon.  Her husband Merry Proud works with Human resources officer at [employer]. She has two children, [place], age  11 and Gabby, age 77.       ADVANCED DIRECTIVES:    HEALTH MAINTENANCE: Social History   Tobacco  Use  . Smoking status: Current Every Day Smoker    Packs/day: 0.25    Types: Cigarettes  Substance Use Topics  . Alcohol use: Yes    Alcohol/week: 2.0 standard drinks    Types: 2 Cans of beer per week  . Drug use: Never     Colonoscopy:   PAP: 2018  Bone density:    No Known Allergies  Current Outpatient Medications  Medication Sig Dispense Refill  . escitalopram (LEXAPRO) 10 MG tablet Take 10 mg by mouth daily.    . ferrous sulfate 325 (65 FE) MG tablet Take 325 mg by mouth 2 (two) times daily.    . Multiple Vitamin (MULTIVITAMIN) tablet Take 1 tablet by mouth daily.    . Probiotic Product (PROBIOTIC ADVANCED PO) Take 1 tablet by mouth.    . zolpidem (AMBIEN) 10 MG tablet Take 10 mg by mouth at bedtime as needed for sleep.     No current facility-administered medications for this visit.     OBJECTIVE: Young appearing white woman in no acute distress  Vitals:   02/26/18 0826  BP: (!) 116/51  Pulse: 72  Resp: 18  Temp: 98.6 F (37 C)  SpO2: 100%     Body mass index is 25.49 kg/m.   Wt Readings from Last 3 Encounters:  02/26/18 140 lb 8 oz (63.7 kg)  11/21/11 150 lb 6.4 oz (68.2 kg)      ECOG FS:0 - Asymptomatic  Ocular: Sclerae unicteric, pupils round and equal Ear-nose-throat: Oropharynx clear and moist Lymphatic: No cervical or supraclavicular adenopathy Lungs no rales or rhonchi Heart regular rate and rhythm Abd soft, nontender, positive bowel sounds MSK no focal spinal tenderness, no joint edema Neuro: non-focal, well-oriented, appropriate affect Breasts: The right breast is status post recent biopsy.  There is no palpable mass.  There are no skin or nipple changes of concern.  The left breast is benign.  Both axillae are benign.   LAB RESULTS:  CMP     Component Value Date/Time   NA 140 02/26/2018 0814   K 4.3 02/26/2018 0814   CL 107 02/26/2018 0814   CO2 23 02/26/2018 0814   GLUCOSE 90 02/26/2018 0814   BUN 9 02/26/2018 0814   CREATININE 0.87  02/26/2018 0814   CALCIUM 8.6 (L) 02/26/2018 0814   PROT 6.9 02/26/2018 0814   ALBUMIN 4.0 02/26/2018 0814   AST 20 02/26/2018 0814   ALT 15 02/26/2018 0814   ALKPHOS 36 (L) 02/26/2018 0814   BILITOT <0.2 (L) 02/26/2018 0814   GFRNONAA >60 02/26/2018 0814   GFRAA >60 02/26/2018 0814    No results found for: TOTALPROTELP, ALBUMINELP, A1GS, A2GS, BETS, BETA2SER, GAMS, MSPIKE, SPEI  No results found for: KPAFRELGTCHN, LAMBDASER, KAPLAMBRATIO  Lab Results  Component Value Date   WBC 4.3 02/26/2018   NEUTROABS 2.5 02/26/2018   HGB 9.9 (L) 02/26/2018   HCT 32.7 (L) 02/26/2018   MCV 79.6 02/26/2018   PLT 308 02/26/2018    '@LASTCHEMISTRY'$ @  No results found for: LABCA2  No components found for: QASTMH962  No results for input(s): INR in the last 168 hours.  No results found for: LABCA2  No results found for: IWL798  No results found for: XQJ194  No results found for: RDE081  No results found for: CA2729  No components found for: HGQUANT  No results  found for: CEA1 / No results found for: CEA1   No results found for: AFPTUMOR  No results found for: [place]  No results found for: PSA1  Appointment on 02/26/2018  Component Date Value Ref Range Status  . Sodium 02/26/2018 140  135 - 145 mmol/L Final  . Potassium 02/26/2018 4.3  3.5 - 5.1 mmol/L Final  . Chloride 02/26/2018 107  98 - 111 mmol/L Final  . CO2 02/26/2018 23  22 - 32 mmol/L Final  . Glucose, Bld 02/26/2018 90  70 - 99 mg/dL Final  . BUN 02/26/2018 9  6 - 20 mg/dL Final  . Creatinine 02/26/2018 0.87  0.44 - 1.00 mg/dL Final  . Calcium 02/26/2018 8.6* 8.9 - 10.3 mg/dL Final  . Total Protein 02/26/2018 6.9  6.5 - 8.1 g/dL Final  . Albumin 02/26/2018 4.0  3.5 - 5.0 g/dL Final  . AST 02/26/2018 20  15 - 41 U/L Final  . ALT 02/26/2018 15  0 - 44 U/L Final  . Alkaline Phosphatase 02/26/2018 36* 38 - 126 U/L Final  . Total Bilirubin 02/26/2018 <0.2* 0.3 - 1.2 mg/dL Final  . GFR, Est Non Af Am 02/26/2018  >60  >60 mL/min Final  . GFR, Est AFR Am 02/26/2018 >60  >60 mL/min Final   Comment: (NOTE) The eGFR has been calculated using the CKD EPI equation. This calculation has not been validated in all clinical situations. eGFR's persistently <60 mL/min signify possible Chronic Kidney Disease.   Georgiann Hahn gap 02/26/2018 10  5 - 15 Final   Performed at [facility], [place] [address], [place], [place] [address]  . WBC Count 02/26/2018 4.3  3.9 - 10.3 K/uL Final  . RBC 02/26/2018 4.11  3.70 - 5.45 MIL/uL Final  . Hemoglobin 02/26/2018 9.9* 11.6 - 15.9 g/dL Final  . HCT 02/26/2018 32.7* 34.8 - 46.6 % Final  . MCV 02/26/2018 79.6  79.5 - 101.0 fL Final  . MCH 02/26/2018 24.1* 25.1 - 34.0 pg Final  . MCHC 02/26/2018 30.3* 31.5 - 36.0 g/dL Final  . RDW 02/26/2018 16.6* 11.2 - 14.5 % Final  . Platelet Count 02/26/2018 308  145 - 400 K/uL Final  . Neutrophils Relative % 02/26/2018 57  % Final  . Neutro Abs 02/26/2018 2.5  1.5 - 6.5 K/uL Final  . Lymphocytes Relative 02/26/2018 28  % Final  . Lymphs Abs 02/26/2018 1.2  0.9 - 3.3 K/uL Final  . Monocytes Relative 02/26/2018 6  % Final  . Monocytes Absolute 02/26/2018 0.3  0.1 - 0.9 K/uL Final  . Eosinophils Relative 02/26/2018 7  % Final  . Eosinophils Absolute 02/26/2018 0.3  0.0 - 0.5 K/uL Final  . Basophils Relative 02/26/2018 2  % Final  . Basophils Absolute 02/26/2018 0.1  0.0 - 0.1 K/uL Final   Performed at [facility], [place] [address], [place], [place] [address]    (this displays the last labs from the last 3 days)  No results found for: TOTALPROTELP, ALBUMINELP, A1GS, A2GS, BETS, BETA2SER, GAMS, MSPIKE, SPEI (this displays SPEP labs)  No results found for: KPAFRELGTCHN, LAMBDASER, KAPLAMBRATIO (kappa/lambda light chains)  No results found for: HGBA, HGBA2QUANT, HGBFQUANT, HGBSQUAN (Hemoglobinopathy evaluation)   No results found for: LDH  Lab Results  Component Value Date   IRON 14 (L)  11/21/2011   TIBC 413 11/21/2011   IRONPCTSAT 3 (L) 11/21/2011   (Iron and TIBC)  Lab Results  Component Value Date   FERRITIN 14  11/21/2011    Urinalysis    Component Value Date/Time   COLORURINE YELLOW 07/18/2009 0827   APPEARANCEUR CLEAR 07/18/2009 0827   LABSPEC 1.025 07/18/2009 0827   PHURINE 5.5 07/18/2009 0827   GLUCOSEU NEGATIVE 07/18/2009 0827   HGBUR NEGATIVE 07/18/2009 0827   BILIRUBINUR NEGATIVE 07/18/2009 0827   KETONESUR 15 (A) 07/18/2009 0827   PROTEINUR NEGATIVE 07/18/2009 0827   UROBILINOGEN 0.2 07/18/2009 0827   NITRITE NEGATIVE 07/18/2009 0827   LEUKOCYTESUR  07/18/2009 0827    NEGATIVE MICROSCOPIC NOT DONE ON URINES WITH NEGATIVE PROTEIN, BLOOD, LEUKOCYTES, NITRITE, OR GLUCOSE <1000 mg/dL.     STUDIES:  Mm Digital Diagnostic Unilat R  Result Date: 02/04/2018 CLINICAL DATA:  Patient returns after screening study for evaluation of RIGHT breast calcifications. EXAM: DIGITAL DIAGNOSTIC RIGHT MAMMOGRAM WITH CAD COMPARISON:  01/29/2018 and earlier ACR Breast Density Category c: The breast tissue is heterogeneously dense, which may obscure small masses. FINDINGS: Magnified views are performed of RIGHT breast calcifications. Within the UPPER INNER QUADRANT of the RIGHT breast there is a small group of linear and punctate calcifications measuring 1.4 x 1.0 x 0.4 centimeters. Mammographic images were processed with CAD. IMPRESSION: Indeterminate RIGHT breast calcifications for which biopsy is recommended. RECOMMENDATION: Stereotactic guided core biopsy of RIGHT breast calcifications, scheduled for the patient on 02/07/2018 I have discussed the findings and recommendations with the patient. Results were also provided in writing at the conclusion of the visit. If applicable, a reminder letter will be sent to the patient regarding the next appointment. BI-RADS CATEGORY  4: Suspicious. Electronically Signed   By: Nolon Nations M.D.   On: 02/04/2018 09:31   Mm 3d Screen  Breast Bilateral  Result Date: 01/29/2018 CLINICAL DATA:  Screening. EXAM: DIGITAL SCREENING BILATERAL MAMMOGRAM WITH TOMO AND CAD COMPARISON:  Previous exam(s). ACR Breast Density Category c: The breast tissue is heterogeneously dense, which may obscure small masses. FINDINGS: In the right breast, calcifications warrant further evaluation with magnified views. In the left breast, no findings suspicious for malignancy. Images were processed with CAD. IMPRESSION: Further evaluation is suggested for calcifications in the right breast. RECOMMENDATION: Diagnostic mammogram of the right breast. (Code:[id number]) The patient will be contacted regarding the findings, and additional imaging will be scheduled. BI-RADS CATEGORY  0: Incomplete. Need additional imaging evaluation and/or prior mammograms for comparison. Electronically Signed   By: Lajean Manes M.D.   On: 01/29/2018 16:15   Mm Clip Placement Right  Result Date: 02/07/2018 CLINICAL DATA:  Confirmation clip placement after stereotactic tomosynthesis core needle biopsy of indeterminate calcifications involving the UPPER INNER QUADRANT of the RIGHT breast at anterior depth. EXAM: DIAGNOSTIC RIGHT MAMMOGRAM POST STEREOTACTIC BIOPSY COMPARISON:  Previous exam(s). FINDINGS: Mammographic images were obtained following stereotactic tomosynthesis guided biopsy of calcifications involving the UPPER INNER QUADRANT of the RIGHT breast at anterior depth. The coil shaped tissue marker clip is appropriately positioned at the posterior margin of the biopsied group of calcifications. Expected post biopsy changes are present without evidence of hematoma. IMPRESSION: Appropriate positioning the coil shaped tissue marker clip at the posterior margin of the biopsied group of calcifications in the UPPER INNER QUADRANT of the RIGHT breast. Final Assessment: Post Procedure Mammograms for Marker Placement Electronically Signed   By: Evangeline Dakin M.D.   On: 02/07/2018 11:23   Mm  Rt Breast Bx W Loc Dev 1st Lesion Image Bx Spec Stereo Guide  Addendum Date: 02/11/2018   ADDENDUM REPORT: 02/11/2018 14:16 ADDENDUM: Pathology revealed HIGH GRADE MAMMARY CARCINOMA IN  SITU WITH NECROSIS of the Right breast, upper inner quadrant at anterior depth. The in-situ carcinoma is positive for E-cadherin, consistent with a ductal phenotype. This was found to be concordant by Dr. Peggye Fothergill. Pathology results were discussed with the patient by telephone. The patient reported doing well after the biopsy with tenderness at the site. Post biopsy instructions and care were reviewed and questions were answered. The patient was encouraged to call [facility] for any additional concerns. The patient was referred to [facility] on February 19, 2018. Pathology results reported by Terie Purser, RN on 02/11/2018. Electronically Signed   By: Evangeline Dakin M.D.   On: 02/11/2018 14:16   Result Date: 02/11/2018 CLINICAL DATA:  Screening detected indeterminate approximate 1.5 cm group calcifications involving the UPPER INNER QUADRANT of the RIGHT breast at ANTERIOR depth. EXAM: RIGHT BREAST STEREOTACTIC CORE NEEDLE BIOPSY COMPARISON:  Previous exams. FINDINGS: The patient and I discussed the procedure of stereotactic-guided biopsy including benefits and alternatives. We discussed the high likelihood of a successful procedure. We discussed the risks of the procedure including infection, bleeding, tissue injury, clip migration, and inadequate sampling. Informed written consent was given. The usual time out protocol was performed immediately prior to the procedure. Using sterile technique with chlorhexidine as skin antisepsis, 1% lidocaine and 1% lidocaine with epinephrine as local anesthetic, under stereotactic guidance, a 9 gauge Brevera vacuum assisted device was used to perform core needle biopsy of calcifications  involving the UPPER INNER QUADRANT of the RIGHT breast using a SUPERIOR approach. Specimen radiograph was performed showing calcifications in the core samples. Specimens with calcifications are identified for pathology. Lesion quadrant: UPPER INNER QUADRANT. At the conclusion of the procedure, a coil shaped tissue marker clip was deployed into the biopsy cavity. Follow-up 2-view mammogram was performed and dictated separately. IMPRESSION: Stereotactic-guided biopsy of an approximate 1.5 cm group of indeterminate calcifications in the UPPER INNER QUADRANT the RIGHT breast at ANTERIOR depth. The patient had a vasovagal episode immediately after the biopsy while obtaining the post clip diagnostic mammogram. This resolved spontaneously and no intervention was necessary. The patient's blood pressure was 139/73 and her pulse rate was 59 prior to discharge. Electronically Signed: By: Evangeline Dakin M.D. On: 02/07/2018 11:22    ELIGIBLE FOR AVAILABLE RESEARCH PROTOCOL: no  ASSESSMENT: 47 y.o. [place] woman (soon to moved to [place]) status post right breast upper inner quadrant biopsy 02/07/2018 for ductal carcinoma in situ, grade 3, estrogen and progesterone receptor positive.  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 Specifically we reviewed the biology of the patient's diagnosis and the specifics of her situation. Ann Memos understands that in noninvasive ductal carcinoma, also called ductal carcinoma in situ ("DCIS") the breast cancer cells remain trapped in the ducts were they started. They cannot travel to a vital organ. For that reason these cancers  in themselves are not life-threatening.  If the whole breast is removed then all the ducts are removed and since the cancer cells are trapped in the ducts, the cure rate with mastectomy for noninvasive breast cancer is approximately 99%.  Nevertheless we recommend lumpectomy, because there is no survival advantage to mastectomy and because the cosmetic result is generally superior with breast conservation.  Since the patient is keeping her breasts, there will be some risk of recurrence. The recurrence can only be in the same breast since, again, the cells are trapped in the ducts. There is no connection from one breast to the other. The risk of local recurrence is cut by more than half with radiation, which is standard in this situation.  In estrogen receptor positive cancers like Teri's, anti-estrogens can also be considered. They will further reduce the risk of recurrence by one half. In addition anti-estrogens will lower the risk of a new breast cancer developing in either breast, also by one half. That risk approaches 1% per year. Anti-estrogens reduce it to 1/2%.  Accordingly the overall plan is for surgery, followed by radiation, then a discussion of anti-estrogens.  She does qualify for genetics testing. In patients who carry a deleterious mutation [for example in a  BRCA gene], the risk of a new breast cancer developing in the future may be sufficiently great that the patient may choose bilateral mastectomies. However if she wishes to keep her breasts in that situation it is safe to do so. That would require intensified screening, which generally means not only yearly mammography but a yearly breast MRI as well. Of course, if there is a deleterious mutation bilateral oophorectomy would be necessary as there is no standard screening protocol for ovarian cancer.  Note that even if genetics testing is negative, she is still at high risk of future breast cancers and should undergo intensified  screening.   Finally she has no MCV greater than 80 and may carry a thalassemia trait, or she may be borderline iron deficient.  I have asked her to go ahead and start iron supplementation now and we will check a ferritin and repeat a CBC at her next visit here.  Ameriah has a good understanding of the overall plan. She agrees with it. She knows the goal of treatment in her case is cure. She will call with any problems that may develop before her next visit here.  Magrinat, Virgie Dad, MD  02/26/18 11:06 AM Medical Oncology and Hematology [facility] [address] [place], [place] [address] Tel. [phone]    Fax. [phone]    I, Soijett Blue am acting as scribe for Dr. Sarajane Jews C. Magrinat.  I, Lurline Del MD, have reviewed the above documentation for accuracy and completeness, and I agree with the above.

## 2018-02-26 ENCOUNTER — Ambulatory Visit: Payer: BC Managed Care – PPO | Admitting: Physical Therapy

## 2018-02-26 ENCOUNTER — Ambulatory Visit: Payer: Self-pay | Admitting: General Surgery

## 2018-02-26 ENCOUNTER — Encounter: Payer: Self-pay | Admitting: Radiation Oncology

## 2018-02-26 ENCOUNTER — Inpatient Hospital Stay: Payer: BC Managed Care – PPO | Attending: Oncology | Admitting: Oncology

## 2018-02-26 ENCOUNTER — Ambulatory Visit
Admission: RE | Admit: 2018-02-26 | Discharge: 2018-02-26 | Disposition: A | Discharge status: Home / Self Care | Payer: Self-pay | Source: Ambulatory Visit | Attending: Radiation Oncology | Admitting: Radiation Oncology

## 2018-02-26 ENCOUNTER — Encounter: Payer: Self-pay | Admitting: Oncology

## 2018-02-26 ENCOUNTER — Inpatient Hospital Stay: Payer: BC Managed Care – PPO

## 2018-02-26 VITALS — BP 116/51 | HR 72 | Temp 98.6°F | Resp 18 | Ht 62.25 in | Wt 140.5 lb

## 2018-02-26 DIAGNOSIS — D0511 Intraductal carcinoma in situ of right breast: Secondary | ICD-10-CM

## 2018-02-26 DIAGNOSIS — Z803 Family history of malignant neoplasm of breast: Secondary | ICD-10-CM | POA: Diagnosis not present

## 2018-02-26 DIAGNOSIS — Z808 Family history of malignant neoplasm of other organs or systems: Secondary | ICD-10-CM | POA: Insufficient documentation

## 2018-02-26 DIAGNOSIS — Z801 Family history of malignant neoplasm of trachea, bronchus and lung: Secondary | ICD-10-CM | POA: Insufficient documentation

## 2018-02-26 DIAGNOSIS — Z170 Estrogen receptor positive status [ER+]: Secondary | ICD-10-CM | POA: Diagnosis not present

## 2018-02-26 DIAGNOSIS — Z720 Tobacco use: Secondary | ICD-10-CM | POA: Insufficient documentation

## 2018-02-26 DIAGNOSIS — D509 Iron deficiency anemia, unspecified: Secondary | ICD-10-CM | POA: Insufficient documentation

## 2018-02-26 DIAGNOSIS — Z8041 Family history of malignant neoplasm of ovary: Secondary | ICD-10-CM | POA: Diagnosis not present

## 2018-02-26 DIAGNOSIS — D508 Other iron deficiency anemias: Secondary | ICD-10-CM

## 2018-02-26 LAB — CMP (CANCER CENTER ONLY)
ALBUMIN: 4 g/dL (ref 3.5–5.0)
ALT: 15 U/L (ref 0–44)
ANION GAP: 10 (ref 5–15)
AST: 20 U/L (ref 15–41)
Alkaline Phosphatase: 36 U/L — ABNORMAL LOW (ref 38–126)
BUN: 9 mg/dL (ref 6–20)
CO2: 23 mmol/L (ref 22–32)
Calcium: 8.6 mg/dL — ABNORMAL LOW (ref 8.9–10.3)
Chloride: 107 mmol/L (ref 98–111)
Creatinine: 0.87 mg/dL (ref 0.44–1.00)
GFR, Est AFR Am: >60 mL/min (ref >60)
GFR, Estimated: >60 mL/min (ref >60)
GLUCOSE: 90 mg/dL (ref 70–99)
Potassium: 4.3 mmol/L (ref 3.5–5.1)
SODIUM: 140 mmol/L (ref 135–145)
TOTAL PROTEIN: 6.9 g/dL (ref 6.5–8.1)

## 2018-02-26 LAB — CBC WITH DIFFERENTIAL (CANCER CENTER ONLY)
BASOS ABS: 0.1 10*3/uL (ref 0.0–0.1)
Basophils Relative: 2 %
Eosinophils Absolute: 0.3 10*3/uL (ref 0.0–0.5)
Eosinophils Relative: 7 %
HEMATOCRIT: 32.7 % — AB (ref 34.8–46.6)
HEMOGLOBIN: 9.9 g/dL — AB (ref 11.6–15.9)
LYMPHS PCT: 28 %
Lymphs Abs: 1.2 10*3/uL (ref 0.9–3.3)
MCH: 24.1 pg — ABNORMAL LOW (ref 25.1–34.0)
MCHC: 30.3 g/dL — ABNORMAL LOW (ref 31.5–36.0)
MCV: 79.6 fL (ref 79.5–101.0)
MONO ABS: 0.3 10*3/uL (ref 0.1–0.9)
Monocytes Relative: 6 %
NEUTROS PCT: 57 %
Neutro Abs: 2.5 10*3/uL (ref 1.5–6.5)
Platelet Count: 308 10*3/uL (ref 145–400)
RBC: 4.11 MIL/uL (ref 3.70–5.45)
RDW: 16.6 % — AB (ref 11.2–14.5)
WBC Count: 4.3 10*3/uL (ref 3.9–10.3)

## 2018-02-26 NOTE — Progress Notes (Signed)
Nutrition Assessment  Reason for Assessment:  Pt seen in Breast Clinic  ASSESSMENT:  47 year old female with new diagnosis of breast cancer.  Past medical history reviewed.  Patient reports normal appetite.  Medications:  reviewed  Labs: reviewed  Anthropometrics:   Height: 62.5 inches Weight: 140 lb 8 oz BMI: 25.5   NUTRITION DIAGNOSIS: Food and nutrition related knowledge deficit related to new diagnosis of breast cancer as evidenced by no prior need for nutrition related information.  INTERVENTION:   Discussed and provided packet of information regarding nutritional tips for breast cancer patients.  Questions answered.  Teachback method used.  Contact information provided and patient knows to contact me with questions/concerns.    MONITORING, EVALUATION, and GOAL: Pt will consume a healthy plant based diet to maintain lean body mass throughout treatment.   Price Lachapelle B. Freida BusmanAllen, RD, LDN Registered Dietitian [phone] (pager)

## 2018-02-27 ENCOUNTER — Telehealth: Payer: Self-pay | Admitting: Oncology

## 2018-02-27 NOTE — Telephone Encounter (Signed)
Per 8/14 no los °

## 2018-03-03 ENCOUNTER — Other Ambulatory Visit: Payer: Self-pay | Admitting: General Surgery

## 2018-03-03 ENCOUNTER — Telehealth: Payer: Self-pay | Admitting: *Deleted

## 2018-03-03 DIAGNOSIS — D0511 Intraductal carcinoma in situ of right breast: Secondary | ICD-10-CM

## 2018-03-03 NOTE — Telephone Encounter (Signed)
Spoke with patient and appointments have been changed to September because her new insurance does not kick in until 9/1.

## 2018-03-04 ENCOUNTER — Ambulatory Visit (HOSPITAL_COMMUNITY): Payer: BC Managed Care – PPO

## 2018-03-05 ENCOUNTER — Ambulatory Visit (HOSPITAL_COMMUNITY): Admission: RE | Admit: 2018-03-05 | Payer: BC Managed Care – PPO | Source: Ambulatory Visit

## 2018-03-21 ENCOUNTER — Encounter (HOSPITAL_COMMUNITY): Payer: Self-pay

## 2018-03-21 ENCOUNTER — Ambulatory Visit (HOSPITAL_COMMUNITY)
Admission: RE | Admit: 2018-03-21 | Discharge: 2018-03-21 | Disposition: A | Discharge status: Home / Self Care | Payer: BC Managed Care – PPO | Source: Ambulatory Visit | Attending: General Surgery | Admitting: General Surgery

## 2018-03-21 DIAGNOSIS — D0511 Intraductal carcinoma in situ of right breast: Secondary | ICD-10-CM

## 2018-03-21 MED ORDER — GADOBUTROL 1 MMOL/ML IV SOLN
6.0000 mL | Freq: Once | INTRAVENOUS | Status: AC | PRN
  Administered 2018-03-21: 7 mL via INTRAVENOUS

## 2018-03-26 ENCOUNTER — Encounter: Payer: BC Managed Care – PPO | Admitting: Genetic Counselor

## 2018-03-26 ENCOUNTER — Other Ambulatory Visit: Payer: BC Managed Care – PPO

## 2018-03-26 ENCOUNTER — Other Ambulatory Visit: Payer: Self-pay

## 2018-03-27 ENCOUNTER — Telehealth: Payer: Self-pay | Admitting: *Deleted

## 2018-03-27 NOTE — Telephone Encounter (Signed)
Error

## 2018-03-28 ENCOUNTER — Telehealth: Payer: Self-pay | Admitting: Oncology

## 2018-03-28 NOTE — Telephone Encounter (Signed)
Patient wanted to be referred to Radiation [place]cology in [place]. Spoke with Talbert ForestShirley from [facility] [place] and referred patient, records will be faxed to [phone]. Patient was made aware of appointment set for October 2, @ 1215. Will also mail CD with images to [address] [place] as well.

## 2018-04-02 ENCOUNTER — Encounter (HOSPITAL_BASED_OUTPATIENT_CLINIC_OR_DEPARTMENT_OTHER): Payer: Self-pay | Admitting: *Deleted

## 2018-04-02 ENCOUNTER — Other Ambulatory Visit: Payer: Self-pay

## 2018-04-07 ENCOUNTER — Ambulatory Visit
Admission: RE | Admit: 2018-04-07 | Discharge: 2018-04-07 | Disposition: A | Discharge status: Home / Self Care | Payer: BC Managed Care – PPO | Source: Ambulatory Visit | Attending: General Surgery | Admitting: General Surgery

## 2018-04-07 DIAGNOSIS — D0511 Intraductal carcinoma in situ of right breast: Secondary | ICD-10-CM

## 2018-04-08 NOTE — H&P (Signed)
History of Present Illness  The patient is a 47 year old female who presents with breast cancer. She is a pre-menopausal female referred by Dr. Lyman BishopLawrence for evaluation of recently diagnosed carcinoma of the right breast. She recently presented for a screening mamogram revealing new calcifications in the right breast. Subsequent imaging included diagnostic mamogram showing a new area of suspicious calcifications in the right breast measuring 1.4 cm upper inner quadrant anterior depth. a stereotactic guided breast biopsy was performed on 02/07/2018 with pathology revealing DCIS of the breast. She is seen now in breast multidisciplinary clinic for initial treatment planning. She has experienced no breast symptoms, specifically lump or nipple discharge or skin changes. She does not have a personal history of any previous breast problems. she has a family history of breast cancer in her mother and her paternal grandmother  Findings at that time were the following: Tumor size: 1.4 cm Tumor grade: high Estrogen Receptor: positive Progesterone Receptor: positive     Problem List/Past Medical  Non-Contributory Problem List/Past Medical History  No Known Problems   Past Surgical History  Discectomy   Allergies  No Known Allergies   Medication History  Medications Reconciled Lexapro (10MG  Tablet, Oral daily) Active. Ambien (10MG  Tablet, Oral as needed) Active.    Review of Systems ( All other systems negative   Physical Exam  The physical exam findings are as follows: Note:General: Alert, fit-appearing Caucasian female, in no distress Skin: Warm and dry without rash or infection. HEENT: No palpable masses or thyromegaly. Sclera nonicteric. Lymph nodes: No cervical, supraclavicular, nodes palpable. Breasts: No palpable masses in either breast. No skin changes or nipple discharge or crusting. No palpable axillary adenopathy. Lungs: Breath sounds clear and equal. No wheezing or  increased work of breathing. Cardiovascular: Regular rate and rhythm without murmer. No JVD or edema. Abdomen: Nondistended. Soft and nontender. No masses palpable. No organomegaly. No palpable hernias. Extremities: No edema or joint swelling or deformity. No chronic venous stasis changes. Neurologic: Alert and fully oriented. Gait normal. No focal weakness. Psychiatric: Normal mood and affect. 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 She has a family history of breast cancer in her mother. We discussed surgical treatment options including lumpectomy and mastectomy and she prefers breast conservation and appears to be a good candidate. We discussed preoperative MRI to further assess her tumor as she has very dense breast tissue and she is in agreement with this. Assuming the MRI shows nothing that would change our surgical plan we discussed radioactive seed localized lumpectomy in detail including its nature and expected recovery, risks of anesthetic complications, bleeding, infection and possible need for further surgery based on final pathology. We discussed genetic testing and she would like to pursue this. This would not change her decision regarding breast conservation for her current tumor. [place] and likely will have any follow-up treatment done there but desires to have her surgery done here. We will go ahead and schedule her for lumpectomy pending MRI results.  Current Plans radioactive seed localized right breast lumpectomy under general anesthesia as an outpatient pending results of bilateral breast MRI. Note, subsequent MRI has shown no suspicious enhancement or mass in either breast.  Referral for  genetic testing  which will not influence decision regarding surgery.

## 2018-04-09 ENCOUNTER — Encounter (HOSPITAL_BASED_OUTPATIENT_CLINIC_OR_DEPARTMENT_OTHER): Payer: Self-pay | Admitting: *Deleted

## 2018-04-09 ENCOUNTER — Ambulatory Visit (HOSPITAL_BASED_OUTPATIENT_CLINIC_OR_DEPARTMENT_OTHER): Payer: BC Managed Care – PPO | Admitting: Anesthesiology

## 2018-04-09 ENCOUNTER — Ambulatory Visit
Admission: RE | Admit: 2018-04-09 | Discharge: 2018-04-09 | Disposition: A | Discharge status: Home / Self Care | Payer: BC Managed Care – PPO | Source: Ambulatory Visit | Attending: General Surgery | Admitting: General Surgery

## 2018-04-09 ENCOUNTER — Ambulatory Visit (HOSPITAL_BASED_OUTPATIENT_CLINIC_OR_DEPARTMENT_OTHER)
Admission: RE | Admit: 2018-04-09 | Discharge: 2018-04-09 | Disposition: A | Discharge status: Home / Self Care | Payer: BC Managed Care – PPO | Source: Ambulatory Visit | Attending: General Surgery | Admitting: General Surgery

## 2018-04-09 ENCOUNTER — Other Ambulatory Visit: Payer: Self-pay

## 2018-04-09 ENCOUNTER — Encounter (HOSPITAL_BASED_OUTPATIENT_CLINIC_OR_DEPARTMENT_OTHER)
Admission: RE | Disposition: A | Discharge status: Home / Self Care | Payer: Self-pay | Source: Ambulatory Visit | Attending: General Surgery

## 2018-04-09 DIAGNOSIS — Z87891 Personal history of nicotine dependence: Secondary | ICD-10-CM | POA: Insufficient documentation

## 2018-04-09 DIAGNOSIS — Z803 Family history of malignant neoplasm of breast: Secondary | ICD-10-CM | POA: Diagnosis not present

## 2018-04-09 DIAGNOSIS — Z170 Estrogen receptor positive status [ER+]: Secondary | ICD-10-CM | POA: Insufficient documentation

## 2018-04-09 DIAGNOSIS — D0511 Intraductal carcinoma in situ of right breast: Secondary | ICD-10-CM

## 2018-04-09 HISTORY — PX: BREAST LUMPECTOMY WITH RADIOACTIVE SEED LOCALIZATION: SHX6424

## 2018-04-09 LAB — POCT PREGNANCY, URINE: PREG TEST UR: NEGATIVE

## 2018-04-09 SURGERY — BREAST LUMPECTOMY WITH RADIOACTIVE SEED LOCALIZATION
Anesthesia: General | Site: Breast | Laterality: Right

## 2018-04-09 MED ORDER — CHLORHEXIDINE GLUCONATE CLOTH 2 % EX PADS: 6.0000 | MEDICATED_PAD | Freq: Once | CUTANEOUS | Status: DC

## 2018-04-09 MED ORDER — FENTANYL CITRATE (PF) 100 MCG/2ML IJ SOLN: 25.0000 ug | INTRAMUSCULAR | Status: DC | PRN

## 2018-04-09 MED ORDER — OXYCODONE HCL 5 MG PO TABS: 5.0000 mg | ORAL_TABLET | Freq: Once | ORAL | Status: DC | PRN

## 2018-04-09 MED ORDER — MEPERIDINE HCL 25 MG/ML IJ SOLN: 6.2500 mg | INTRAMUSCULAR | Status: DC | PRN

## 2018-04-09 MED ORDER — LIDOCAINE 2% (20 MG/ML) 5 ML SYRINGE
INTRAMUSCULAR | Status: AC
  Filled 2018-04-09: qty 5

## 2018-04-09 MED ORDER — OXYCODONE HCL 5 MG/5ML PO SOLN: 5.0000 mg | Freq: Once | ORAL | Status: DC | PRN

## 2018-04-09 MED ORDER — GABAPENTIN 300 MG PO CAPS
300.0000 mg | ORAL_CAPSULE | ORAL | Status: AC
  Administered 2018-04-09: 300 mg via ORAL

## 2018-04-09 MED ORDER — LACTATED RINGERS IV SOLN
INTRAVENOUS | Status: DC
  Administered 2018-04-09: 10 mL/h via INTRAVENOUS

## 2018-04-09 MED ORDER — EPHEDRINE SULFATE 50 MG/ML IJ SOLN
INTRAMUSCULAR | Status: DC | PRN
  Administered 2018-04-09: 15 mg via INTRAVENOUS

## 2018-04-09 MED ORDER — MIDAZOLAM HCL 2 MG/2ML IJ SOLN
INTRAMUSCULAR | Status: AC
  Filled 2018-04-09: qty 2

## 2018-04-09 MED ORDER — CELECOXIB 200 MG PO CAPS
200.0000 mg | ORAL_CAPSULE | ORAL | Status: AC
  Administered 2018-04-09: 200 mg via ORAL

## 2018-04-09 MED ORDER — LIDOCAINE HCL (CARDIAC) PF 100 MG/5ML IV SOSY
PREFILLED_SYRINGE | INTRAVENOUS | Status: DC | PRN
  Administered 2018-04-09: 100 mg via INTRAVENOUS

## 2018-04-09 MED ORDER — GABAPENTIN 300 MG PO CAPS
ORAL_CAPSULE | ORAL | Status: AC
  Filled 2018-04-09: qty 1

## 2018-04-09 MED ORDER — CEFAZOLIN SODIUM-DEXTROSE 2-4 GM/100ML-% IV SOLN
2.0000 g | INTRAVENOUS | Status: AC
  Administered 2018-04-09: 2 g via INTRAVENOUS

## 2018-04-09 MED ORDER — ONDANSETRON HCL 4 MG/2ML IJ SOLN
INTRAMUSCULAR | Status: AC
  Filled 2018-04-09: qty 2

## 2018-04-09 MED ORDER — ACETAMINOPHEN 500 MG PO TABS
1000.0000 mg | ORAL_TABLET | ORAL | Status: AC
  Administered 2018-04-09: 1000 mg via ORAL

## 2018-04-09 MED ORDER — DEXAMETHASONE SODIUM PHOSPHATE 10 MG/ML IJ SOLN
INTRAMUSCULAR | Status: AC
  Filled 2018-04-09: qty 1

## 2018-04-09 MED ORDER — FENTANYL CITRATE (PF) 100 MCG/2ML IJ SOLN
INTRAMUSCULAR | Status: AC
  Filled 2018-04-09: qty 2

## 2018-04-09 MED ORDER — FENTANYL CITRATE (PF) 100 MCG/2ML IJ SOLN: 50.0000 ug | INTRAMUSCULAR | Status: DC | PRN

## 2018-04-09 MED ORDER — PHENYLEPHRINE 40 MCG/ML (10ML) SYRINGE FOR IV PUSH (FOR BLOOD PRESSURE SUPPORT)
PREFILLED_SYRINGE | INTRAVENOUS | Status: AC
  Filled 2018-04-09: qty 10

## 2018-04-09 MED ORDER — SCOPOLAMINE 1 MG/3DAYS TD PT72: 1.0000 | MEDICATED_PATCH | Freq: Once | TRANSDERMAL | Status: DC | PRN

## 2018-04-09 MED ORDER — ACETAMINOPHEN 500 MG PO TABS
ORAL_TABLET | ORAL | Status: AC
  Filled 2018-04-09: qty 2

## 2018-04-09 MED ORDER — MIDAZOLAM HCL 5 MG/5ML IJ SOLN
INTRAMUSCULAR | Status: DC | PRN
  Administered 2018-04-09: 2 mg via INTRAVENOUS

## 2018-04-09 MED ORDER — EPHEDRINE 5 MG/ML INJ
INTRAVENOUS | Status: AC
  Filled 2018-04-09: qty 10

## 2018-04-09 MED ORDER — PROMETHAZINE HCL 25 MG/ML IJ SOLN: 6.2500 mg | INTRAMUSCULAR | Status: DC | PRN

## 2018-04-09 MED ORDER — CELECOXIB 200 MG PO CAPS
ORAL_CAPSULE | ORAL | Status: AC
  Filled 2018-04-09: qty 1

## 2018-04-09 MED ORDER — BUPIVACAINE-EPINEPHRINE (PF) 0.25% -1:200000 IJ SOLN
INTRAMUSCULAR | Status: DC | PRN
  Administered 2018-04-09: 30 mL

## 2018-04-09 MED ORDER — DEXAMETHASONE SODIUM PHOSPHATE 4 MG/ML IJ SOLN
INTRAMUSCULAR | Status: DC | PRN
  Administered 2018-04-09: 10 mg via INTRAVENOUS

## 2018-04-09 MED ORDER — FENTANYL CITRATE (PF) 100 MCG/2ML IJ SOLN
INTRAMUSCULAR | Status: DC | PRN
  Administered 2018-04-09 (×2): 50 ug via INTRAVENOUS

## 2018-04-09 MED ORDER — TRAMADOL HCL 50 MG PO TABS: 50.0000 mg | ORAL_TABLET | Freq: Four times a day (QID) | ORAL | 1 refills | Status: DC | PRN

## 2018-04-09 MED ORDER — MIDAZOLAM HCL 2 MG/2ML IJ SOLN: 1.0000 mg | INTRAMUSCULAR | Status: DC | PRN

## 2018-04-09 MED ORDER — CEFAZOLIN SODIUM-DEXTROSE 2-4 GM/100ML-% IV SOLN
INTRAVENOUS | Status: AC
  Filled 2018-04-09: qty 100

## 2018-04-09 MED ORDER — ONDANSETRON HCL 4 MG/2ML IJ SOLN
INTRAMUSCULAR | Status: DC | PRN
  Administered 2018-04-09: 4 mg via INTRAVENOUS

## 2018-04-09 MED ORDER — SUCCINYLCHOLINE CHLORIDE 200 MG/10ML IV SOSY
PREFILLED_SYRINGE | INTRAVENOUS | Status: AC
  Filled 2018-04-09: qty 10

## 2018-04-09 SURGICAL SUPPLY — 54 items
ADH SKN CLS APL DERMABOND .7 (GAUZE/BANDAGES/DRESSINGS) ×1
BINDER BREAST LRG (GAUZE/BANDAGES/DRESSINGS) IMPLANT
BINDER BREAST MEDIUM (GAUZE/BANDAGES/DRESSINGS) ×2 IMPLANT
BINDER BREAST XLRG (GAUZE/BANDAGES/DRESSINGS) IMPLANT
BINDER BREAST XXLRG (GAUZE/BANDAGES/DRESSINGS) IMPLANT
BLADE SURG 15 STRL LF DISP TIS (BLADE) ×1 IMPLANT
BLADE SURG 15 STRL SS (BLADE) ×3
CANISTER SUC SOCK COL 7IN (MISCELLANEOUS) IMPLANT
CANISTER SUCT 1200ML W/VALVE (MISCELLANEOUS) ×2 IMPLANT
CHLORAPREP W/TINT 26ML (MISCELLANEOUS) ×3 IMPLANT
CLIP VESOCCLUDE SM WIDE 6/CT (CLIP) ×2 IMPLANT
COVER BACK TABLE 60X90IN (DRAPES) ×3 IMPLANT
COVER MAYO STAND STRL (DRAPES) ×3 IMPLANT
COVER PROBE W GEL 5X96 (DRAPES) ×3 IMPLANT
DECANTER SPIKE VIAL GLASS SM (MISCELLANEOUS) IMPLANT
DERMABOND ADVANCED (GAUZE/BANDAGES/DRESSINGS) ×2
DERMABOND ADVANCED .7 DNX12 (GAUZE/BANDAGES/DRESSINGS) ×1 IMPLANT
DEVICE DUBIN W/COMP PLATE 8390 (MISCELLANEOUS) ×3 IMPLANT
DRAPE LAPAROSCOPIC ABDOMINAL (DRAPES) ×3 IMPLANT
DRAPE UTILITY XL STRL (DRAPES) ×3 IMPLANT
ELECT COATED BLADE 2.86 ST (ELECTRODE) ×3 IMPLANT
ELECT REM PT RETURN 9FT ADLT (ELECTROSURGICAL) ×3
ELECTRODE REM PT RTRN 9FT ADLT (ELECTROSURGICAL) ×1 IMPLANT
GLOVE BIOGEL PI IND STRL 7.0 (GLOVE) IMPLANT
GLOVE BIOGEL PI IND STRL 8 (GLOVE) ×1 IMPLANT
GLOVE BIOGEL PI INDICATOR 7.0 (GLOVE) ×2
GLOVE BIOGEL PI INDICATOR 8 (GLOVE) ×2
GLOVE ECLIPSE 7.5 STRL STRAW (GLOVE) ×5 IMPLANT
GLOVE EXAM NITRILE MD LF STRL (GLOVE) ×2 IMPLANT
GLOVE SURG SYN 7.5  E (GLOVE) ×2
GLOVE SURG SYN 7.5 E (GLOVE) ×1 IMPLANT
GLOVE SURG SYN 7.5 PF PI (GLOVE) IMPLANT
GOWN STRL REUS W/ TWL LRG LVL3 (GOWN DISPOSABLE) ×1 IMPLANT
GOWN STRL REUS W/ TWL XL LVL3 (GOWN DISPOSABLE) ×1 IMPLANT
GOWN STRL REUS W/TWL LRG LVL3 (GOWN DISPOSABLE)
GOWN STRL REUS W/TWL XL LVL3 (GOWN DISPOSABLE) ×6
ILLUMINATOR WAVEGUIDE N/F (MISCELLANEOUS) IMPLANT
KIT MARKER MARGIN INK (KITS) ×3 IMPLANT
LIGHT WAVEGUIDE WIDE FLAT (MISCELLANEOUS) IMPLANT
NDL HYPO 25X1 1.5 SAFETY (NEEDLE) ×1 IMPLANT
NEEDLE HYPO 25X1 1.5 SAFETY (NEEDLE) ×3 IMPLANT
NS IRRIG 1000ML POUR BTL (IV SOLUTION) ×2 IMPLANT
PACK BASIN DAY SURGERY FS (CUSTOM PROCEDURE TRAY) ×3 IMPLANT
PENCIL BUTTON HOLSTER BLD 10FT (ELECTRODE) ×3 IMPLANT
SLEEVE SCD COMPRESS KNEE MED (MISCELLANEOUS) ×3 IMPLANT
SPONGE LAP 4X18 RFD (DISPOSABLE) ×3 IMPLANT
SUT MON AB 5-0 PS2 18 (SUTURE) ×3 IMPLANT
SUT VICRYL 3-0 CR8 SH (SUTURE) ×3 IMPLANT
SYR CONTROL 10ML LL (SYRINGE) ×3 IMPLANT
TOWEL GREEN STERILE FF (TOWEL DISPOSABLE) ×3 IMPLANT
TOWEL OR NON WOVEN STRL DISP B (DISPOSABLE) ×1 IMPLANT
TUBE CONNECTING 20'X1/4 (TUBING) ×1
TUBE CONNECTING 20X1/4 (TUBING) ×1 IMPLANT
YANKAUER SUCT BULB TIP NO VENT (SUCTIONS) ×2 IMPLANT

## 2018-04-09 NOTE — Transfer of Care (Signed)
Immediate Anesthesia Transfer of Care Note  Patient: Ann Patton  Procedure(s) Performed: RIGHT BREAST LUMPECTOMY WITH RADIOACTIVE SEED LOCALIZATION (Right Breast)  Patient Location: PACU  Anesthesia Type:General  Level of Consciousness: awake, alert , oriented and drowsy  Airway & Oxygen Therapy: Patient Spontanous Breathing and Patient connected to face mask oxygen  Post-op Assessment: Report given to RN and Post -op Vital signs reviewed and stable  Post vital signs: Reviewed and stable  Last Vitals:  Vitals Value Taken Time  BP    Temp    Pulse 86 04/09/2018  9:49 AM  Resp 17 04/09/2018  9:49 AM  SpO2 100 % 04/09/2018  9:49 AM  Vitals shown include unvalidated device data.  Last Pain:  Vitals:   04/09/18 0725  TempSrc: Oral  PainSc: 0-No pain      Patients Stated Pain Goal: 0 (04/09/18 0725)  Complications: No apparent anesthesia complications

## 2018-04-09 NOTE — Interval H&P Note (Signed)
History and Physical Interval Note:  04/09/2018 8:21 AM  Ann Patton  has presented today for surgery, with the diagnosis of RIGHT BREAST DCIS  The various methods of treatment have been discussed with the patient and family. After consideration of risks, benefits and other options for treatment, the patient has consented to  Procedure(s): BREAST LUMPECTOMY WITH RADIOACTIVE SEED LOCALIZATION (Right) as a surgical intervention .  The patient's history has been reviewed, patient examined, no change in status, stable for surgery.  I have reviewed the patient's chart and labs.  Questions were answered to the patient's satisfaction.     Lorne Skeens [name]

## 2018-04-09 NOTE — Discharge Instructions (Signed)
[facility] °Office Phone Number [phone] ° °BREAST BIOPSY: POST OP INSTRUCTIONS ° °Always review your discharge instruction sheet given to you by the facility where your surgery was performed. ° °IF YOU HAVE DISABILITY OR FAMILY LEAVE FORMS, YOU MUST BRING THEM TO THE OFFICE FOR PROCESSING.  DO NOT GIVE THEM TO YOUR DOCTOR. ° °1. A prescription for pain medication may be given to you upon discharge.  Take your pain medication as prescribed, if needed.  If narcotic pain medicine is not needed, then you may take acetaminophen (Tylenol) or ibuprofen (Advil) as needed. °2. Take your usually prescribed medications unless otherwise directed °3. If you need a refill on your pain medication, please contact your pharmacy.  They will contact our office to request authorization.  Prescriptions will not be filled after 5pm or on week-ends. °4. You should eat very light the first 24 hours after surgery, such as soup, crackers, pudding, etc.  Resume your normal diet the day after surgery. °5. Most patients will experience some swelling and bruising in the breast.  Ice packs and a good support bra will help.  Swelling and bruising can take several days to resolve.  °6. It is common to experience some constipation if taking pain medication after surgery.  Increasing fluid intake and taking a stool softener will usually help or prevent this problem from occurring.  A mild laxative (Milk of Magnesia or Miralax) should be taken according to package directions if there are no bowel movements after 48 hours. °7. Unless discharge instructions indicate otherwise, you may remove your bandages 24-48 hours after surgery, and you may shower at that time.  You may have steri-strips (small skin tapes) in place directly over the incision.  These strips should be left on the skin for 7-10 days.  If your surgeon used skin glue on the incision, you may shower in 24 hours.  The glue will flake off over the next 2-3 weeks.  Any  sutures or staples will be removed at the office during your follow-up visit. °8. ACTIVITIES:  You may resume regular daily activities (gradually increasing) beginning the next day.  Wearing a good support bra or sports bra minimizes pain and swelling.  You may have sexual intercourse when it is comfortable. °a. You may drive when you no longer are taking prescription pain medication, you can comfortably wear a seatbelt, and you can safely maneuver your car and apply brakes. °b. RETURN TO WORK:  ______________________________________________________________________________________ °9. You should see your doctor in the office for a follow-up appointment approximately two weeks after your surgery.  Your doctor’s nurse will typically make your follow-up appointment when she calls you with your pathology report.  Expect your pathology report 2-3 business days after your surgery.  You may call to check if you do not hear from us after three days. °10. OTHER INSTRUCTIONS: _______________________________________________________________________________________________ _____________________________________________________________________________________________________________________________________ °_____________________________________________________________________________________________________________________________________ °_____________________________________________________________________________________________________________________________________ ° °WHEN TO CALL YOUR DOCTOR: °1. Fever over 101.0 °2. Nausea and/or vomiting. °3. Extreme swelling or bruising. °4. Continued bleeding from incision. °5. Increased pain, redness, or drainage from the incision. ° °The clinic staff is available to answer your questions during regular business hours.  Please don’t hesitate to call and ask to speak to one of the nurses for clinical concerns.  If you have a medical emergency, go to the nearest emergency room or call  911.  A surgeon from [facility] is always on call at the hospital. ° °For further questions, please visit [email or web address]  ° ° °  Post Anesthesia Home Care Instructions ° °Activity: °Get plenty of rest for the remainder of the day. A responsible individual must stay with you for 24 hours following the procedure.  °For the next 24 hours, DO NOT: °-Drive a car °-Operate machinery °-Drink alcoholic beverages °-Take any medication unless instructed by your physician °-Make any legal decisions or sign important papers. ° °Meals: °Start with liquid foods such as gelatin or soup. Progress to regular foods as tolerated. Avoid greasy, spicy, heavy foods. If nausea and/or vomiting occur, drink only clear liquids until the nausea and/or vomiting subsides. Call your physician if vomiting continues. ° °Special Instructions/Symptoms: °Your throat may feel dry or sore from the anesthesia or the breathing tube placed in your throat during surgery. If this causes discomfort, gargle with warm salt water. The discomfort should disappear within 24 hours. ° °If you had a scopolamine patch placed behind your ear for the management of post- operative nausea and/or vomiting: ° °1. The medication in the patch is effective for 72 hours, after which it should be removed.  Wrap patch in a tissue and discard in the trash. Wash hands thoroughly with soap and water. °2. You may remove the patch earlier than 72 hours if you experience unpleasant side effects which may include dry mouth, dizziness or visual disturbances. °3. Avoid touching the patch. Wash your hands with soap and water after contact with the patch. °  ° °

## 2018-04-09 NOTE — Anesthesia Procedure Notes (Signed)
Procedure Name: LMA Insertion Date/Time: 04/09/2018 8:51 AM Performed by: Ronnette Hila, CRNA Pre-anesthesia Checklist: Patient identified, Emergency Drugs available, Suction available and Patient being monitored Patient Re-evaluated:Patient Re-evaluated prior to induction Oxygen Delivery Method: Circle system utilized Preoxygenation: Pre-oxygenation with 100% oxygen Induction Type: IV induction Ventilation: Mask ventilation without difficulty LMA: LMA inserted LMA Size: 3.0 Number of attempts: 1 Airway Equipment and Method: Bite block Placement Confirmation: positive ETCO2 Tube secured with: Tape Dental Injury: Teeth and Oropharynx as per pre-operative assessment

## 2018-04-09 NOTE — Anesthesia Preprocedure Evaluation (Signed)
Anesthesia Evaluation  Patient identified by MRN, date of birth, ID band Patient awake    Reviewed: Allergy & Precautions, NPO status , Patient's Chart, lab work & pertinent test results  Airway Mallampati: II  TM Distance: >3 FB Neck ROM: Full    Dental no notable dental hx. (+) Dental Advisory Given, Teeth Intact   Pulmonary former smoker,    Pulmonary exam normal breath sounds clear to auscultation       Cardiovascular negative cardio ROS Normal cardiovascular exam Rhythm:Regular Rate:Normal     Neuro/Psych negative neurological ROS  negative psych ROS   GI/Hepatic negative GI ROS, Neg liver ROS,   Endo/Other  negative endocrine ROS  Renal/GU negative Renal ROS     Musculoskeletal negative musculoskeletal ROS (+)   Abdominal   Peds  Hematology negative hematology ROS (+) anemia ,   Anesthesia Other Findings   Reproductive/Obstetrics negative OB ROS                            Anesthesia Physical Anesthesia Plan  ASA: I  Anesthesia Plan: General   Post-op Pain Management:    Induction: Intravenous  PONV Risk Score and Plan: 3 and Ondansetron, Dexamethasone, Midazolam, Scopolamine patch - Pre-op and Treatment may vary due to age or medical condition  Airway Management Planned: LMA  Additional Equipment: None  Intra-op Plan:   Post-operative Plan: Extubation in OR  Informed Consent: I have reviewed the patients History and Physical, chart, labs and discussed the procedure including the risks, benefits and alternatives for the proposed anesthesia with the patient or authorized representative who has indicated his/her understanding and acceptance.   Dental advisory given  Plan Discussed with: CRNA  Anesthesia Plan Comments:         Anesthesia Quick Evaluation

## 2018-04-09 NOTE — Op Note (Signed)
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  After extensive preoperative evaluation and discussion detailed elsewhere we have elected to proceed with radioactive seed localized right breast lumpectomy as initial surgical therapy.    Procedure Detail: Patient had previously undergone accurate placement of a radioactive seed at the tumor and clip site in the periareolar upper inner right breast.  Seed was confirmed in the holding area.  She was taken to the operating room, placed in the supine position on the operating table, and laryngeal mask general anesthesia induced.  She received preoperative IV antibiotics.  PAS were in place.  Patient timeout was performed and correct procedure verified.  The neoprobe was used to localize the seed just off the areolar border.  I used a circumareolar incision and dissection was carried down into the subtenons tissue.  A thin skin and subcutaneous flap was raised superiorly and medially.  Using the neoprobe for guidance I excised a globular approximately 2-1/2 cm specimen surrounding the seed.  This was inked for margins.  Specimen x-ray showed the seed and marking clip in the center of the specimen.  Complete hemostasis was assured.  Soft tissue was infiltrated with Marcaine.  The lumpectomy cavity was marked with clips.  The deep breast and subtenons tissue was closed with interrupted 3-0 Vicryl.  Skin was closed with running some particular 5-0 Monocryl and Dermabond.  Sponge needle and instrument counts were correct.    Findings: As above  Estimated Blood Loss:  Minimal         Drains: None  Blood Given:  none          Specimens: Right breast lumpectomy        Complications:  * No complications entered in OR log *         Disposition: PACU - hemodynamically stable.         Condition: stable

## 2018-04-10 ENCOUNTER — Encounter (HOSPITAL_BASED_OUTPATIENT_CLINIC_OR_DEPARTMENT_OTHER): Payer: Self-pay | Admitting: General Surgery

## 2018-04-10 NOTE — Anesthesia Postprocedure Evaluation (Signed)
Anesthesia Post Note  Patient: Ann Patton  Procedure(s) Performed: RIGHT BREAST LUMPECTOMY WITH RADIOACTIVE SEED LOCALIZATION (Right Breast)     Patient location during evaluation: PACU Anesthesia Type: General Level of consciousness: sedated and patient cooperative Pain management: pain level controlled Vital Signs Assessment: post-procedure vital signs reviewed and stable Respiratory status: spontaneous breathing Cardiovascular status: stable Anesthetic complications: no    Last Vitals:  Vitals:   04/09/18 1030 04/09/18 1035  BP: 114/68 110/67  Pulse: 73 75  Resp: 14 18  Temp:  36.7 C  SpO2: 100% 98%    Last Pain:  Vitals:   04/09/18 1035  TempSrc:   PainSc: 0-No pain                 Lewie Loron

## 2018-04-18 ENCOUNTER — Other Ambulatory Visit: Payer: Self-pay | Admitting: *Deleted

## 2018-04-21 ENCOUNTER — Inpatient Hospital Stay: Payer: BC Managed Care – PPO | Admitting: Licensed Clinical Social Worker

## 2018-04-21 ENCOUNTER — Inpatient Hospital Stay: Payer: BC Managed Care – PPO

## 2018-05-12 ENCOUNTER — Inpatient Hospital Stay: Payer: BC Managed Care – PPO | Attending: Oncology | Admitting: Licensed Clinical Social Worker

## 2018-05-12 ENCOUNTER — Inpatient Hospital Stay: Payer: BC Managed Care – PPO

## 2018-05-12 ENCOUNTER — Encounter: Payer: Self-pay | Admitting: Licensed Clinical Social Worker

## 2018-05-12 DIAGNOSIS — Z8041 Family history of malignant neoplasm of ovary: Secondary | ICD-10-CM | POA: Insufficient documentation

## 2018-05-12 DIAGNOSIS — Z315 Encounter for genetic counseling: Secondary | ICD-10-CM

## 2018-05-12 DIAGNOSIS — Z808 Family history of malignant neoplasm of other organs or systems: Secondary | ICD-10-CM | POA: Insufficient documentation

## 2018-05-12 DIAGNOSIS — Z803 Family history of malignant neoplasm of breast: Secondary | ICD-10-CM

## 2018-05-12 DIAGNOSIS — Z806 Family history of leukemia: Secondary | ICD-10-CM | POA: Insufficient documentation

## 2018-05-12 DIAGNOSIS — D0511 Intraductal carcinoma in situ of right breast: Secondary | ICD-10-CM | POA: Diagnosis not present

## 2018-05-12 DIAGNOSIS — Z801 Family history of malignant neoplasm of trachea, bronchus and lung: Secondary | ICD-10-CM | POA: Diagnosis not present

## 2018-05-12 NOTE — Progress Notes (Signed)
REFERRING PROVIDER: Chauncey Cruel, MD [address] [place], [place] [address]  PRIMARY PROVIDER:  Camille Bal, PA-C  PRIMARY REASON FOR VISIT:  1. Ductal carcinoma in situ (DCIS) of right breast   2. Family history of breast cancer   3. Family history of ovarian cancer   4. Family history of lung cancer   5. Family history of melanoma   6. Family history of leukemia      HISTORY OF PRESENT ILLNESS:   Ann Patton, a 47 y.o. female, was seen for a [facility] cancer genetics consultation at the request of Dr. Jana Hakim due to a personal and family history of cancer.  Ann Patton presents to clinic today to discuss the possibility of a hereditary predisposition to cancer, genetic testing, and to further clarify her future cancer risks, as well as potential cancer risks for family members.   In 2019, at the age of 49, Ann Patton was diagnosed with right breast cancer, DCIS. This was treated with lumpectomy thus far and there are plans for radiation.   HORMONAL RISK FACTORS:  Menarche was at age 42.  First live birth at age 66.  OCP use for approximately > 10 years Ovaries intact: yes.  Hysterectomy: no.  Menopausal status: premenopausal.  HRT use: 0 years. Colonoscopy: no; not examined. Mammogram within the last year: yes. Number of breast biopsies: 1.  Past Medical History:  Diagnosis Date  . Anemia   . Anxiety   . Family history of breast cancer   . Family history of leukemia   . Family history of lung cancer   . Family history of melanoma   . Family history of ovarian cancer     Past Surgical History:  Procedure Laterality Date  . BREAST LUMPECTOMY WITH RADIOACTIVE SEED LOCALIZATION Right 04/09/2018   Procedure: RIGHT BREAST LUMPECTOMY WITH RADIOACTIVE SEED LOCALIZATION;  Surgeon: Excell Seltzer, MD;  Location: [facility];  Service: General;  Laterality: Right;  . c sections    . TUBAL LIGATION      Social History    Socioeconomic History  . Marital status: Married    Spouse name: Not on file  . Number of children: Not on file  . Years of education: Not on file  . Highest education level: Not on file  Occupational History  . Not on file  Social Needs  . Financial resource strain: Not on file  . Food insecurity:    Worry: Not on file    Inability: Not on file  . Transportation needs:    Medical: Not on file    Non-medical: Not on file  Tobacco Use  . Smoking status: Former Smoker    Packs/day: 0.25    Types: Cigarettes  . Smokeless tobacco: Never Used  Substance and Sexual Activity  . Alcohol use: Yes    Alcohol/week: 2.0 standard drinks    Types: 2 Cans of beer per week  . Drug use: Never  . Sexual activity: Not on file  Lifestyle  . Physical activity:    Days per week: Not on file    Minutes per session: Not on file  . Stress: Not on file  Relationships  . Social connections:    Talks on phone: Not on file    Gets together: Not on file    Attends religious service: Not on file    Active member of club or organization: Not on file    Attends meetings of clubs or organizations: Not  on file    Relationship status: Not on file  Other Topics Concern  . Not on file  Social History Narrative  . Not on file     FAMILY HISTORY:  We obtained a detailed, 4-generation family history.  Significant diagnoses are listed below: Family History  Problem Relation Age of Onset  . Hypertension Mother   . Ovarian cancer Mother 52  . Lung cancer Mother 18  . Skin cancer Father        Melanoma  . Breast cancer Paternal Grandmother        dx 76s, recurrence in 11s  . Lung cancer Maternal Aunt        d. 63s  . Leukemia Paternal Aunt   . Skin cancer Maternal Grandmother    Ann Patton has two daughters, 73 and 10. She has a full sister with no cancer history, as well as two half sisters through her father with no cancer history.   Ann Patton father has a history of melanoma on his  back. He had a sister who had a history of leukemia and died in her 17's-70's, and a brother who is living at 46. No cancers for the patient's paternal cousins. Ann Patton paternal grandmother had breast cancer diagnosed in her 97's with a recurrence in her 68's, she passed away in her 49's. Ann Patton did not know her grandfather.  Ann Patton mother was diagnosed with lung cancer at 2, ovarian cancer at 46, and patient reports she had negative genetic testing in 2015 but the report was not available for review during the session. Ann Patton had two maternal aunts and two maternal uncles. One aunt had lung cancer and passed away in her 49's. She did not smoke but was exposed to asbestos. No cancers for maternal cousins. Ann Patton maternal grandmother had skin cancer, she died at 57. The patient's grandfather died in his 31's.   Ann Patton is aware of previous family history of genetic testing for hereditary cancer risks. Patient's maternal ancestors are of Caucasian descent, and paternal ancestors are of Caucasian descent. There is no reported Ashkenazi Jewish ancestry. There is no known consanguinity.  GENETIC COUNSELING ASSESSMENT: Ann Patton is a 47 y.o. female with a personal and family history which is somewhat suggestive of a Hereditary Cancer Predisposition Syndrome. We, therefore, discussed and recommended the following at today's visit.   DISCUSSION: We discussed that about 5-10% of breast cancer cases are hereditary with most cases due to BRCA mutations. Other genes associated with hereditary breast cancer cases include ATM, CHEK2 and PALB2. We noted that there are many other genes we can test for that are associated with many types of cancers. We reviewed the characteristics, features and inheritance patterns of hereditary cancer syndromes. We also discussed genetic testing, including the appropriate family members to test, the process of testing, insurance  coverage and turn-around-time for results. We discussed the implications of a negative, positive and/or variant of uncertain significant result. We recommended Ann Patton pursue genetic testing for the [employer].  The Multi-Cancer Panel offered by [facility] includes sequencing and/or deletion duplication testing of the following 84 genes: AIP, ALK, APC, ATM, AXIN2,BAP1,  BARD1, BLM, BMPR1A, BRCA1, BRCA2, BRIP1, CASR, CDC73, CDH1, CDK4, CDKN1B, CDKN1C, CDKN2A (p14ARF), CDKN2A (p16INK4a), CEBPA, CHEK2, CTNNA1, DICER1, DIS3L2, EGFR (c.2369C>T, p.Thr790Met variant only), EPCAM (Deletion/duplication testing only), FH, FLCN, GATA2, GPC3, GREM1 (Promoter region deletion/duplication testing only), HOXB13 (c.251G>A, p.Gly84Glu), HRAS, KIT, MAX, MEN1, MET, MITF (c.952G>A, p.Glu318Lys variant only),  MLH1, MSH2, MSH3, MSH6, MUTYH, NBN, NF1, NF2, NTHL1, PALB2, PDGFRA, PHOX2B, PMS2, POLD1, POLE, POT1, PRKAR1A, PTCH1, PTEN, RAD50, RAD51C, RAD51D, RB1, RECQL4, RET, RUNX1, SDHAF2, SDHA (sequence changes only), SDHB, SDHC, SDHD, SMAD4, SMARCA4, SMARCB1, SMARCE1, STK11, SUFU, TERC, TERT, TMEM127, TP53, TSC1, TSC2, VHL, WRN and WT1.   We discussed that if she is found to have a mutation in one of these genes, it may impact surgical decisions, and alter future medical management recommendations such as increased cancer screenings and consideration of risk reducing surgeries.  A positive result could also have implications for the patient's family members.  A Negative result would mean we were unable to identify a hereditary component to her cancer, but does not rule out the possibility of a hereditary basis for her cancer.  There could be mutations that are undetectable by current technology, or in genes not yet tested or identified to increase cancer risk.    We discussed the potential to find a Variant of Uncertain Significance or VUS.  These are variants that have not yet been identified as pathogenic or  benign, and it is unknown if this variant is associated with increased cancer risk or if this is a normal finding.  Most VUS's are reclassified to benign or likely benign.   It should not be used to make medical management decisions. With time, we suspect the lab will determine the significance of any VUS's identified if any.   Based on Ann Patton's personal and family history of cancer, she meets NCCN medical criteria for genetic testing. Despite that she meets criteria, she may still have an out of pocket cost. The lab will provide her with an out of pocket cost, if any.  PLAN: After considering the risks, benefits, and limitations, Ann Patton  provided informed consent to pursue genetic testing and the blood sample was sent to [facility] for analysis of the Multi-Cancer Panel. Results should be available within approximately 2-3 weeks' time, at which point they will be disclosed by telephone to Ann Patton, as will any additional recommendations warranted by these results. Ann Patton will receive a summary of her genetic counseling visit and a copy of her results once available. This information will also be available in Epic.   Lastly, we encouraged Ann Patton to remain in contact with cancer genetics annually so that we can continuously update the family history and inform her of any changes in cancer genetics and testing that may be of benefit for this family.   Ms.  Deremer questions were answered to her satisfaction today. Our contact information was provided should additional questions or concerns arise. Thank you for the referral and allowing [place] to share in the care of your patient.   Faith Rogue, MS Genetic Counselor [email or web address] Phone: [phone]   The patient was seen for a total of 35 minutes in face-to-face genetic counseling.

## 2018-05-15 ENCOUNTER — Inpatient Hospital Stay: Payer: BC Managed Care – PPO | Admitting: Adult Health

## 2018-05-15 ENCOUNTER — Inpatient Hospital Stay: Payer: BC Managed Care – PPO

## 2018-05-16 ENCOUNTER — Telehealth: Payer: Self-pay | Admitting: Oncology

## 2018-05-16 NOTE — Telephone Encounter (Signed)
Scheduled appt per 1/11 sch message- pt aware of appt date and time   

## 2018-05-26 ENCOUNTER — Telehealth: Payer: Self-pay | Admitting: Licensed Clinical Social Worker

## 2018-05-26 ENCOUNTER — Encounter: Payer: Self-pay | Admitting: Licensed Clinical Social Worker

## 2018-05-26 DIAGNOSIS — Z1379 Encounter for other screening for genetic and chromosomal anomalies: Secondary | ICD-10-CM | POA: Insufficient documentation

## 2018-05-27 ENCOUNTER — Ambulatory Visit: Payer: Self-pay | Admitting: Licensed Clinical Social Worker

## 2018-05-27 ENCOUNTER — Encounter: Payer: Self-pay | Admitting: Licensed Clinical Social Worker

## 2018-05-27 DIAGNOSIS — Z1379 Encounter for other screening for genetic and chromosomal anomalies: Secondary | ICD-10-CM

## 2018-05-27 NOTE — Telephone Encounter (Signed)
Revealed negative genetic testing.  Revealed that a VUS in ALK was identified. This normal result is reassuring and indicates that it is unlikely Ann Patton's cancer is due to a hereditary cause.  It is unlikely that there is an increased risk of another cancer due to a mutation in one of these genes.  However, genetic testing is not perfect, and cannot definitively rule out a hereditary cause.  It will be important for her to keep in contact with genetics to learn if any additional testing may be needed in the future. We discussed she should continue to follow doctors' recommendations for her cancer screenings. We also discussed that her mother could potentially have more genetic testing and that we are happy to help coordinate this.

## 2018-05-27 NOTE — Progress Notes (Signed)
HPI:  Ms. Tangeman was previously seen in the [facility] clinic on 05/12/2018 due to a personal history of breast cancer, family history of cancer, and concerns regarding a hereditary predisposition to cancer. Please refer to our prior cancer genetics clinic note for more information regarding Ms. Focht's medical, social and family histories, and our assessment and recommendations, at the time. Ms. Gillaspie recent genetic test results were disclosed to her, as well as recommendations warranted by these results. These results and recommendations are discussed in more detail below.  CANCER HISTORY:    Ductal carcinoma in situ (DCIS) of right breast   02/12/2018 Initial Diagnosis    Ductal carcinoma in situ (DCIS) of right breast     Genetic Testing    Negative genetic testing on the [facility] Multi-Cancer Panel The Multi-Cancer Panel offered by [facility] includes sequencing and/or deletion duplication testing of the following 84 genes: AIP, ALK, APC, ATM, AXIN2,BAP1,  BARD1, BLM, BMPR1A, BRCA1, BRCA2, BRIP1, CASR, CDC73, CDH1, CDK4, CDKN1B, CDKN1C, CDKN2A (p14ARF), CDKN2A (p16INK4a), CEBPA, CHEK2, CTNNA1, DICER1, DIS3L2, EGFR (c.2369C>T, p.Thr790Met variant only), EPCAM (Deletion/duplication testing only), FH, FLCN, GATA2, GPC3, GREM1 (Promoter region deletion/duplication testing only), HOXB13 (c.251G>A, p.Gly84Glu), HRAS, KIT, MAX, MEN1, MET, MITF (c.952G>A, p.Glu318Lys variant only), MLH1, MSH2, MSH3, MSH6, MUTYH, NBN, NF1, NF2, NTHL1, PALB2, PDGFRA, PHOX2B, PMS2, POLD1, POLE, POT1, PRKAR1A, PTCH1, PTEN, RAD50, RAD51C, RAD51D, RB1, RECQL4, RET, RUNX1, SDHAF2, SDHA (sequence changes only), SDHB, SDHC, SDHD, SMAD4, SMARCA4, SMARCB1, SMARCE1, STK11, SUFU, TERC, TERT, TMEM127, TP53, TSC1, TSC2, VHL, WRN and WT1.   A VUS was identified: ALK c.361C>T (p.Arg121Trp). The report date is 05/24/2018.      FAMILY HISTORY:  We obtained a detailed, 4-generation family history.  Significant  diagnoses are listed below: Family History  Problem Relation Age of Onset  . Hypertension Mother   . Ovarian cancer Mother 67  . Lung cancer Mother 83  . Skin cancer Father        Melanoma  . Breast cancer Paternal Grandmother        dx 67s, recurrence in 34s  . Lung cancer Maternal Aunt        d. 44s  . Leukemia Paternal Aunt   . Skin cancer Maternal Grandmother    Ms. Byrer has two daughters, 49 and 69. She has a full sister with no cancer history, as well as two half sisters through her father with no cancer history.   Ms. Cooperwood father has a history of melanoma on his back. He had a sister who had a history of leukemia and died in her 11's-70's, and a brother who is living at 46. No cancers for the patient's paternal cousins. Ms. Barrantes paternal grandmother had breast cancer diagnosed in her 25's with a recurrence in her 42's, she passed away in her 13's. Ms. Sortino did not know her grandfather.  Ms. Dasher mother was diagnosed with lung cancer at 19, ovarian cancer at 35, and patient reports she had negative genetic testing in 2015 but the report was not available for review during the session. Ms. Soule had two maternal aunts and two maternal uncles. One aunt had lung cancer and passed away in her 68's. She did not smoke but was exposed to asbestos. No cancers for maternal cousins. Ms. Dibartolo maternal grandmother had skin cancer, she died at 55. The patient's grandfather died in his 64's.   Ms. Mcmasters is aware of previous family history of genetic testing for hereditary cancer risks. Patient's maternal ancestors are  of Caucasian descent, and paternal ancestors are of Caucasian descent. There is no reported Ashkenazi Jewish ancestry. There is no known consanguinity.  GENETIC TEST RESULTS: Genetic testing performed through [facility]'s Multi-Cancer Panel reported out on 05/24/2018 showed no pathogenic mutations. The Multi-Cancer Panel offered by [facility]  includes sequencing and/or deletion duplication testing of the following 84 genes: AIP, ALK, APC, ATM, AXIN2,BAP1,  BARD1, BLM, BMPR1A, BRCA1, BRCA2, BRIP1, CASR, CDC73, CDH1, CDK4, CDKN1B, CDKN1C, CDKN2A (p14ARF), CDKN2A (p16INK4a), CEBPA, CHEK2, CTNNA1, DICER1, DIS3L2, EGFR (c.2369C>T, p.Thr790Met variant only), EPCAM (Deletion/duplication testing only), FH, FLCN, GATA2, GPC3, GREM1 (Promoter region deletion/duplication testing only), HOXB13 (c.251G>A, p.Gly84Glu), HRAS, KIT, MAX, MEN1, MET, MITF (c.952G>A, p.Glu318Lys variant only), MLH1, MSH2, MSH3, MSH6, MUTYH, NBN, NF1, NF2, NTHL1, PALB2, PDGFRA, PHOX2B, PMS2, POLD1, POLE, POT1, PRKAR1A, PTCH1, PTEN, RAD50, RAD51C, RAD51D, RB1, RECQL4, RET, RUNX1, SDHAF2, SDHA (sequence changes only), SDHB, SDHC, SDHD, SMAD4, SMARCA4, SMARCB1, SMARCE1, STK11, SUFU, TERC, TERT, TMEM127, TP53, TSC1, TSC2, VHL, WRN and WT1.  A variant of uncertain significance (VUS) in a gene called ALK was also noted.   The test report will be scanned into EPIC and will be located under the Molecular Pathology section of the Results Review tab. A portion of the result report is included below for reference.     We discussed with Ms. Else that because current genetic testing is not perfect, it is possible there may be a gene mutation in one of these genes that current testing cannot detect, but that chance is small.  We also discussed, that there could be another gene that has not yet been discovered, or that we have not yet tested, that is responsible for the cancer diagnoses in the family. It is also possible there is a hereditary cause for the cancer in the family that Ms. Okelley did not inherit and therefore was not identified in her testing.  Therefore, it is important to remain in touch with cancer genetics in the future so that we can continue to offer Ms. Can the most up to date genetic testing.   Regarding the VUS in ALK: At this time, it is unknown if this  variant is associated with increased cancer risk or if this is a normal finding, but most variants such as this get reclassified to being inconsequential. It should not be used to make medical management decisions. With time, we suspect the lab will determine the significance of this variant, if any. If we do learn more about it, we will try to contact Ms. Muccio to discuss it further. However, it is important to stay in touch with [place] periodically and keep the address and phone number up to date.  ADDITIONAL GENETIC TESTING:   We discussed with Ms. Vanwart that her genetic testing was fairly extensive.  If there are are genes identified to increase cancer risk that can be analyzed in the future, we would be happy to discuss and coordinate this testing at that time.    CANCER SCREENING RECOMMENDATIONS: Ms. Watkin test result is considered negative (normal).  This means that we have not identified a hereditary cause for her personal and family history of cancer at this time.   While reassuring, this does not definitively rule out a hereditary predisposition to cancer. It is still possible that there could be genetic mutations that are undetectable by current technology, or genetic mutations in genes that have not been tested or identified to increase cancer risk.  Therefore, it is recommended she continue to follow the cancer  management and screening guidelines provided by her oncology and primary healthcare provider. An individual's cancer risk is not determined by genetic test results alone.  Overall cancer risk assessment includes additional factors such as personal medical history, family history, etc.  These should be used to make a personalized plan for cancer prevention and surveillance.    RECOMMENDATIONS FOR FAMILY MEMBERS:  Relatives in this family might be at some increased risk of developing cancer, over the general population risk, simply due to the family history of cancer.  We  recommended women in this family have a yearly mammogram beginning at age 34, or 46 years younger than the earliest onset of cancer, an annual clinical breast exam, and perform monthly breast self-exams. Women in this family should also have a gynecological exam as recommended by their primary provider. All family members should have a colonoscopy by age 18 (or as directed by their doctors).  All family members should inform their physicians about the family history of cancer so their doctors can make the most appropriate screening recommendations for them.   It is also possible there is a hereditary cause for the cancer in Ms. Alvi's family that she did not inherit and therefore was not identified in her.  We recommended her mother, who has a history of ovarian cancer, have genetic counseling and testing. Ms. Bedore will let us know if we can be of any assistance in coordinating genetic counseling and/or testing for this family member.   FOLLOW-UP: Lastly, we discussed with Ms. Subia that cancer genetics is a rapidly advancing field and it is possible that new genetic tests will be appropriate for her and/or her family members in the future. We encouraged her to remain in contact with cancer genetics on an annual basis so we can update her personal and family histories and let her know of advances in cancer genetics that may benefit this family.   Our contact number was provided. Ms. Gosch questions were answered to her satisfaction, and she knows she is welcome to call us at anytime with additional questions or concerns.  Faith Rogue, MS Genetic Counselor [email or web address] Phone: [phone]

## 2018-07-21 NOTE — Progress Notes (Signed)
[facility]  [phone] [phone]) [phone]     ID: Ann Patton DOB: Mar 09, 1971  MR#: [id number]  ATF#:[id number]  Patient Care Team: Kieth Brightly as PCP - General (Physician Assistant) Excell Seltzer, MD as Consulting Physician (General Surgery) Makilah Dowda, Virgie Dad, MD as Consulting Physician (Oncology) Eppie Gibson, MD as Attending Physician (Radiation Oncology) OTHER MD:  CHIEF COMPLAINT: Estrogen receptor positive noninvasive breast cancer  CURRENT TREATMENT: tamoxifen   HISTORY OF CURRENT ILLNESS: From the original intake note:  "Con Memos" Tackett had routine screening mammography on 01/29/2018 showing a possible abnormality in the right breast. She underwent bilateral diagnostic mammography with tomography and right breast ultrasonography at [facility] on 02/04/2018 showing: Breast density category C. Indeterminate RIGHT breast calcifications within UIQ, there is a small group of linear and punctate calcifications measuring 1.4 x 1.0 x 0.4 cm.   Accordingly on 02/07/2018 she proceeded to biopsy of the right breast area in question. The pathology from this procedure showed ([id number]): Breast, right, needle core biopsy, upper inner quadrant at anterior depth with mammary carcinoma in situ, high grade with necrosis. Negative for invasive carcinoma. E-cadherin positive. Prognostic indicators significant for: estrogen receptor, 95% positive with strong staining intensity and progesterone receptor, 80%.  The patient's subsequent history is as detailed below.  INTERVAL HISTORY: Ann Patton returns today for follow-up and treatment of her estrogen receptor positive noninvasive breast cancer.   Since her last visit here, she underwent [facility] genetics testing on 05/24/2018. 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 There were no pathogenic mutations found in AIP, ALK, APC, ATM, AXIN2,BAP1,  BARD1, BLM,  BMPR1A, BRCA1, BRCA2, BRIP1, CASR, CDC73, CDH1, CDK4, CDKN1B, CDKN1C, CDKN2A (p14ARF), CDKN2A (p16INK4a), CEBPA, CHEK2, CTNNA1, DICER1, DIS3L2, EGFR (c.2369C>T, p.Thr790Met variant only), EPCAM (Deletion/duplication testing only), FH, FLCN, GATA2, GPC3, GREM1 (Promoter region deletion/duplication testing only), HOXB13 (c.251G>A, p.Gly84Glu), HRAS, KIT, MAX, MEN1, MET, MITF (c.952G>A, p.Glu318Lys variant only), MLH1, MSH2, MSH3, MSH6, MUTYH, NBN, NF1, NF2, NTHL1, PALB2, PDGFRA, PHOX2B, PMS2, POLD1, POLE, POT1, PRKAR1A, PTCH1, PTEN, RAD50, RAD51C, RAD51D, RB1, RECQL4, RET, RUNX1, SDHAF2, SDHA (sequence changes only), SDHB, SDHC, SDHD, SMAD4, SMARCA4, SMARCB1, SMARCE1, STK11, SUFU, TERC, TERT, TMEM127, TP53, TSC1, TSC2, VHL, WRN and WT1.   She underwent a right breast lumpectomy on 04/09/2018. The pathology from this procedure showed ([id number]): ductal carcinoma in situ with calcifications, intermediate grade spanning 0.9 cm. The Surgical resection margins are negative for carcinoma.   She had radiation treatments in [place]. Her skin was "very burnt"; it blistered and peeled, but she is "about 90% back to normal" at the moment. She did not feel fatigued and worked right through treatment.    REVIEW OF SYSTEMS: She is not currently exercising, but she plans on joining a gym and getting into a regular routine. The patient denies unusual headaches, visual changes, nausea, vomiting, or dizziness. There has been no unusual cough, phlegm production, or pleurisy. This been no change in bowel or bladder habits. The patient denies unexplained fatigue or unexplained weight loss, bleeding, rash, or fever. A detailed review of systems was otherwise noncontributory.    PAST MEDICAL HISTORY: Past Medical History:  Diagnosis Date  . Anemia   . Anxiety   . Family history of breast cancer   . Family history of leukemia   . Family history of lung cancer   . Family history of melanoma   . Family history of  ovarian cancer     PAST SURGICAL HISTORY: Past Surgical History:  Procedure Laterality Date  . BREAST LUMPECTOMY WITH RADIOACTIVE SEED LOCALIZATION Right 04/09/2018   Procedure: RIGHT BREAST LUMPECTOMY WITH RADIOACTIVE SEED LOCALIZATION;  Surgeon: Excell Seltzer, MD;  Location: [facility];  Service: General;  Laterality: Right;  . c sections    . TUBAL LIGATION      FAMILY HISTORY Family History  Problem Relation Age of Onset  . Hypertension Mother   . Ovarian cancer Mother 46  . Lung cancer Mother 78  . Skin cancer Father        Melanoma  . Breast cancer Paternal Grandmother        dx 38s, recurrence in 34s  . Lung cancer Maternal Aunt        d. 81s  . Leukemia Paternal Aunt   . Skin cancer Maternal Grandmother    Her mom, [name], was my patient 11 years ago: She was diagnosed with lung cancer at age 49 and ovarian cancer at age 63..  The patient's father is  86 as of August 2019. The patient has no brothers and 3 sisters.    GYNECOLOGIC HISTORY:  She is having regular periods Menarche: 48 years old Age at first live birth: 48 years old [place] P2 LMP: February 15, 2018 Contraceptive: Oral contraceptives more than 10 years without complications.  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  Because of her job they moved to [place], a town outside [place]. Her husband Merry Proud works with Human resources officer at [employer]. She has two children, [place], age 70 and [place], age 72 (as of 07/2018). [name]'s children are in an online school taught live by licensed teachers.    ADVANCED DIRECTIVES:    HEALTH MAINTENANCE: Social History   Tobacco Use  . Smoking status: Former Smoker    Packs/day: 0.25    Types: Cigarettes  . Smokeless tobacco: Never Used  Substance Use Topics  . Alcohol use: Yes    Alcohol/week: 2.0 standard drinks    Types: 2 Cans of  beer per week  . Drug use: Never     Colonoscopy:   PAP: 2018  Bone density:    No Known Allergies  Current Outpatient Medications  Medication Sig Dispense Refill  . albuterol (PROVENTIL HFA;VENTOLIN HFA) 108 (90 Base) MCG/ACT inhaler Inhale into the lungs every 6 (six) hours as needed for wheezing or shortness of breath.    . escitalopram (LEXAPRO) 10 MG tablet Take 10 mg by mouth daily.    . Multiple Vitamin (MULTIVITAMIN) tablet Take 1 tablet by mouth daily.    . Probiotic Product (PROBIOTIC ADVANCED PO) Take 1 tablet by mouth.    . traMADol (ULTRAM) 50 MG tablet Take 1 tablet (50 mg total) by mouth every 6 (six) hours as needed. 12 tablet 1  . zolpidem (AMBIEN) 10 MG tablet Take 10 mg by mouth at bedtime as needed for sleep.     No current facility-administered medications for this visit.     OBJECTIVE: Young white woman who appears well  Vitals:   07/22/18 1516  BP: 117/66  Pulse: 90  Resp: 18  Temp: (!) 97.5 F (36.4 C)  SpO2: 100%     Body mass index is 27.16 kg/m.   Wt Readings from Last 3 Encounters:  07/22/18 149 lb 11.2 oz (67.9 kg)  04/09/18 139 lb 12.4 oz (63.4 kg)  02/26/18 140 lb 8 oz (63.7 kg)  ECOG FS:0 - Asymptomatic  Sclerae unicteric, EOMs intact Oropharynx clear and moist No cervical or supraclavicular adenopathy Lungs no rales or rhonchi Heart regular rate and rhythm Abd soft, nontender, positive bowel sounds MSK no focal spinal tenderness, no upper extremity lymphedema Neuro: nonfocal, well oriented, appropriate affect Breasts: The right breast is status post lumpectomy and radiation.  There is minimal hyperpigmentation remaining.  There is slight skin thickening as expected.  The cosmetic result is very good.  There is no evidence of disease recurrence.  The left breast is benign.  Both axillae are benign.  LAB RESULTS:  CMP     Component Value Date/Time   NA 139 07/22/2018 1451   K 4.0 07/22/2018 1451   CL 105 07/22/2018 1451    CO2 26 07/22/2018 1451   GLUCOSE 97 07/22/2018 1451   BUN 9 07/22/2018 1451   CREATININE 0.84 07/22/2018 1451   CREATININE 0.87 02/26/2018 0814   CALCIUM 8.8 (L) 07/22/2018 1451   PROT 7.2 07/22/2018 1451   ALBUMIN 4.0 07/22/2018 1451   AST 18 07/22/2018 1451   AST 20 02/26/2018 0814   ALT 21 07/22/2018 1451   ALT 15 02/26/2018 0814   ALKPHOS 45 07/22/2018 1451   BILITOT 0.3 07/22/2018 1451   BILITOT <0.2 (L) 02/26/2018 0814   GFRNONAA >60 07/22/2018 1451   GFRNONAA >60 02/26/2018 0814   GFRAA >60 07/22/2018 1451   GFRAA >60 02/26/2018 0814    No results found for: TOTALPROTELP, ALBUMINELP, A1GS, A2GS, BETS, BETA2SER, GAMS, MSPIKE, SPEI  No results found for: KPAFRELGTCHN, LAMBDASER, KAPLAMBRATIO  Lab Results  Component Value Date   WBC 4.2 07/22/2018   NEUTROABS 2.7 07/22/2018   HGB 10.1 (L) 07/22/2018   HCT 33.8 (L) 07/22/2018   MCV 79.9 (L) 07/22/2018   PLT 285 07/22/2018    @LASTCHEMISTRY@  No results found for: LABCA2  No components found for: LABCAN125  No results for input(s): INR in the last 168 hours.  No results found for: LABCA2  No results found for: CAN199  No results found for: CAN125  No results found for: CAN153  No results found for: CA2729  No components found for: HGQUANT  No results found for: CEA1 / No results found for: CEA1   No results found for: AFPTUMOR  No results found for: CHROMOGRNA  No results found for: PSA1  Appointment on 07/22/2018  Component Date Value Ref Range Status  . Sodium 07/22/2018 139  135 - 145 mmol/L Final  . Potassium 07/22/2018 4.0  3.5 - 5.1 mmol/L Final  . Chloride 07/22/2018 105  98 - 111 mmol/L Final  . CO2 07/22/2018 26  22 - 32 mmol/L Final  . Glucose, Bld 07/22/2018 97  70 - 99 mg/dL Final  . BUN 07/22/2018 9  6 - 20 mg/dL Final  . Creatinine, Ser 07/22/2018 0.84  0.44 - 1.00 mg/dL Final  . Calcium 07/22/2018 8.8* 8.9 - 10.3 mg/dL Final  . Total Protein 07/22/2018 7.2  6.5 - 8.1 g/dL Final   . Albumin 07/22/2018 4.0  3.5 - 5.0 g/dL Final  . AST 07/22/2018 18  15 - 41 U/L Final  . ALT 07/22/2018 21  0 - 44 U/L Final  . Alkaline Phosphatase 07/22/2018 45  38 - 126 U/L Final  . Total Bilirubin 07/22/2018 0.3  0.3 - 1.2 mg/dL Final  . GFR calc non Af Amer 07/22/2018 >60  >60 mL/min Final  . GFR calc Af Amer 07/22/2018 >60  >60 mL/min Final  .   Anion gap 07/22/2018 8  5 - 15 Final   Performed at [facility], [address] [place], [place] [address]  . WBC 07/22/2018 4.2  4.0 - 10.5 K/uL Final  . RBC 07/22/2018 4.23  3.87 - 5.11 MIL/uL Final  . Hemoglobin 07/22/2018 10.1* 12.0 - 15.0 g/dL Final  . HCT 07/22/2018 33.8* 36.0 - 46.0 % Final  . MCV 07/22/2018 79.9* 80.0 - 100.0 fL Final  . MCH 07/22/2018 23.9* 26.0 - 34.0 pg Final  . MCHC 07/22/2018 29.9* 30.0 - 36.0 g/dL Final  . RDW 07/22/2018 15.7* 11.5 - 15.5 % Final  . Platelets 07/22/2018 285  150 - 400 K/uL Final  . nRBC 07/22/2018 0.0  0.0 - 0.2 % Final  . Neutrophils Relative % 07/22/2018 63  % Final  . Neutro Abs 07/22/2018 2.7  1.7 - 7.7 K/uL Final  . Lymphocytes Relative 07/22/2018 26  % Final  . Lymphs Abs 07/22/2018 1.1  0.7 - 4.0 K/uL Final  . Monocytes Relative 07/22/2018 6  % Final  . Monocytes Absolute 07/22/2018 0.2  0.1 - 1.0 K/uL Final  . Eosinophils Relative 07/22/2018 4  % Final  . Eosinophils Absolute 07/22/2018 0.2  0.0 - 0.5 K/uL Final  . Basophils Relative 07/22/2018 1  % Final  . Basophils Absolute 07/22/2018 0.0  0.0 - 0.1 K/uL Final  . Immature Granulocytes 07/22/2018 0  % Final  . Abs Immature Granulocytes 07/22/2018 0.01  0.00 - 0.07 K/uL Final   Performed at [facility], [address] [place], [place] [address]    (this displays the last labs from the last 3 days)  No results found for: TOTALPROTELP, ALBUMINELP, A1GS, A2GS, BETS, BETA2SER, GAMS, MSPIKE, SPEI (this displays SPEP labs)  No results found for: KPAFRELGTCHN, LAMBDASER,  KAPLAMBRATIO (kappa/lambda light chains)  No results found for: HGBA, HGBA2QUANT, HGBFQUANT, HGBSQUAN (Hemoglobinopathy evaluation)   No results found for: LDH  Lab Results  Component Value Date   IRON 14 (L) 11/21/2011   TIBC 413 11/21/2011   IRONPCTSAT 3 (L) 11/21/2011   (Iron and TIBC)  Lab Results  Component Value Date   FERRITIN 14 11/21/2011    Urinalysis    Component Value Date/Time   COLORURINE YELLOW 07/18/2009 0827   APPEARANCEUR CLEAR 07/18/2009 0827   LABSPEC 1.025 07/18/2009 0827   PHURINE 5.5 07/18/2009 0827   GLUCOSEU NEGATIVE 07/18/2009 0827   HGBUR NEGATIVE 07/18/2009 0827   BILIRUBINUR NEGATIVE 07/18/2009 0827   KETONESUR 15 (A) 07/18/2009 0827   PROTEINUR NEGATIVE 07/18/2009 0827   UROBILINOGEN 0.2 07/18/2009 0827   NITRITE NEGATIVE 07/18/2009 0827   LEUKOCYTESUR  07/18/2009 0827    NEGATIVE MICROSCOPIC NOT DONE ON URINES WITH NEGATIVE PROTEIN, BLOOD, LEUKOCYTES, NITRITE, OR GLUCOSE <1000 mg/dL.     STUDIES:  Pathology results and genetics results reviewed with the patient  ELIGIBLE FOR AVAILABLE RESEARCH PROTOCOL: no   ASSESSMENT: 47 y.o. [place] woman (soon to moved to [place]) status post right breast upper inner quadrant biopsy 02/07/2018 for ductal carcinoma in situ, grade 3, estrogen and progesterone receptor positive.  (1) status post right lumpectomy on 04/09/2018 for ductal carcinoma in situ, grade 2, measuring 0.9 cm, with negative margins.  (2) adjuvant radiation under Dr. John Hugh Bryan and [place] given between 05/15/2018 and 06/06/2018  (3) tamoxifen started 07/22/2018  (4) high risk patient: consider intensified screening  (5) genetics testing 05/24/2018 through the multi-Cancer Panel offered by [facility] out no pathogenic mutations found in   AIP, ALK, APC, ATM, AXIN2,BAP1,  BARD1, BLM, BMPR1A, BRCA1, BRCA2, BRIP1, CASR, CDC73, CDH1, CDK4, CDKN1B, CDKN1C, CDKN2A (p14ARF), CDKN2A (p16INK4a), CEBPA, CHEK2, CTNNA1,  DICER1, DIS3L2, EGFR (c.2369C>T, p.Thr790Met variant only), EPCAM (Deletion/duplication testing only), FH, FLCN, GATA2, GPC3, GREM1 (Promoter region deletion/duplication testing only), HOXB13 (c.251G>A, p.Gly84Glu), HRAS, KIT, MAX, MEN1, MET, MITF (c.952G>A, p.Glu318Lys variant only), MLH1, MSH2, MSH3, MSH6, MUTYH, NBN, NF1, NF2, NTHL1, PALB2, PDGFRA, PHOX2B, PMS2, POLD1, POLE, POT1, PRKAR1A, PTCH1, PTEN, RAD50, RAD51C, RAD51D, RB1, RECQL4, RET, RUNX1, SDHAF2, SDHA (sequence changes only), SDHB, SDHC, SDHD, SMAD4, SMARCA4, SMARCB1, SMARCE1, STK11, SUFU, TERC, TERT, TMEM127, TP53, TSC1, TSC2, VHL, WRN and WT1.   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  (6) alpha thalassemia trait vs iron deficiency anemia   PLAN: Teri has completed local treatment for her noninvasive breast cancer and at this point her risk of this cancer recurring is less than 10%.  If she took tamoxifen for 5 years that would decrease the risk by 50% but of course that is a very small benefit.  On the other hand her risk of developing another breast cancer is approximately 1 %/year.  This approach is 40% in her case so that is a more significant concern.  Taking tamoxifen for 5 years would also decrease that risk by 50% (from about 40% to about 20% lifetime risk)  We then discussed the possible toxicity side effects and complications of tamoxifen including concerns regarding blood clots and endometrial cancer.  After all this discussion she would like to get tamoxifen a try and I have gone ahead and placed the prescription in for her.  She will give us a phone call early April to tell us how she is doing with tamoxifen.  If she can tolerate it the plan would be to do that for 5 years.  If she does not tolerate 20 mg of tamoxifen well we can go to 10 mg and give that a try  If she wishes to pursue intensified screening she would want to have mammography once a year and then a breast MRI  once a year, alternating by 6 months.  She plans to locate an oncologist in [place] and establish herself in his or her care.  Accordingly we are not making a routine appointment back here for her, but I will be glad to see her at any point in the future if and when the need arises     Magrinat, Gustav C, MD  07/22/18 3:40 PM [facility] [address] [place], [place] [address] Tel. [phone]    Fax. [phone]    I, Amber Handy am acting as a scribe for Gustav C, Magrinat, MD.   I, Gustav Magrinat MD, have reviewed the above documentation for accuracy and completeness, and I agree with the above.    

## 2018-07-22 ENCOUNTER — Inpatient Hospital Stay: Payer: BC Managed Care – PPO | Attending: Oncology | Admitting: Oncology

## 2018-07-22 ENCOUNTER — Inpatient Hospital Stay: Payer: BC Managed Care – PPO

## 2018-07-22 VITALS — BP 117/66 | HR 90 | Temp 97.5°F | Resp 18 | Ht 62.25 in | Wt 149.7 lb

## 2018-07-22 DIAGNOSIS — Z170 Estrogen receptor positive status [ER+]: Secondary | ICD-10-CM | POA: Insufficient documentation

## 2018-07-22 DIAGNOSIS — Z87891 Personal history of nicotine dependence: Secondary | ICD-10-CM | POA: Insufficient documentation

## 2018-07-22 DIAGNOSIS — D0511 Intraductal carcinoma in situ of right breast: Secondary | ICD-10-CM | POA: Insufficient documentation

## 2018-07-22 DIAGNOSIS — D508 Other iron deficiency anemias: Secondary | ICD-10-CM

## 2018-07-22 LAB — COMPREHENSIVE METABOLIC PANEL
ALBUMIN: 4 g/dL (ref 3.5–5.0)
ALK PHOS: 45 U/L (ref 38–126)
ALT: 21 U/L (ref 0–44)
AST: 18 U/L (ref 15–41)
Anion gap: 8 (ref 5–15)
BILIRUBIN TOTAL: 0.3 mg/dL (ref 0.3–1.2)
BUN: 9 mg/dL (ref 6–20)
CALCIUM: 8.8 mg/dL — AB (ref 8.9–10.3)
CO2: 26 mmol/L (ref 22–32)
Chloride: 105 mmol/L (ref 98–111)
Creatinine, Ser: 0.84 mg/dL (ref 0.44–1.00)
GFR calc non Af Amer: >60 mL/min (ref >60)
GLUCOSE: 97 mg/dL (ref 70–99)
POTASSIUM: 4 mmol/L (ref 3.5–5.1)
SODIUM: 139 mmol/L (ref 135–145)
TOTAL PROTEIN: 7.2 g/dL (ref 6.5–8.1)

## 2018-07-22 LAB — CBC WITH DIFFERENTIAL/PLATELET
Abs Immature Granulocytes: 0.01 10*3/uL (ref 0.00–0.07)
Basophils Absolute: 0 10*3/uL (ref 0.0–0.1)
Basophils Relative: 1 %
Eosinophils Absolute: 0.2 10*3/uL (ref 0.0–0.5)
Eosinophils Relative: 4 %
HCT: 33.8 % — ABNORMAL LOW (ref 36.0–46.0)
Hemoglobin: 10.1 g/dL — ABNORMAL LOW (ref 12.0–15.0)
Immature Granulocytes: 0 %
Lymphocytes Relative: 26 %
Lymphs Abs: 1.1 10*3/uL (ref 0.7–4.0)
MCH: 23.9 pg — ABNORMAL LOW (ref 26.0–34.0)
MCHC: 29.9 g/dL — AB (ref 30.0–36.0)
MCV: 79.9 fL — ABNORMAL LOW (ref 80.0–100.0)
Monocytes Absolute: 0.2 10*3/uL (ref 0.1–1.0)
Monocytes Relative: 6 %
NRBC: 0 % (ref 0.0–0.2)
Neutro Abs: 2.7 10*3/uL (ref 1.7–7.7)
Neutrophils Relative %: 63 %
Platelets: 285 10*3/uL (ref 150–400)
RBC: 4.23 MIL/uL (ref 3.87–5.11)
RDW: 15.7 % — ABNORMAL HIGH (ref 11.5–15.5)
WBC: 4.2 10*3/uL (ref 4.0–10.5)

## 2018-07-22 MED ORDER — TAMOXIFEN CITRATE 20 MG PO TABS: 20.0000 mg | ORAL_TABLET | Freq: Every day | ORAL | 12 refills | Status: AC

## 2018-07-23 ENCOUNTER — Telehealth: Payer: Self-pay | Admitting: Oncology

## 2018-07-23 NOTE — Telephone Encounter (Signed)
Per 1/7 no los 

## 2020-01-09 IMAGING — MR MR BILATERAL BREAST WITHOUT AND WITH CONTRAST
6 of 12 series · 20 of 48 positions shown · IV contrast (Yes)
Comparison: Previous exam(s).

CLINICAL DATA: Patient with recent diagnosis right breast mammary
carcinoma in situ.

EXAM:
BILATERAL BREAST MRI WITH AND WITHOUT CONTRAST
TECHNIQUE: Multiplanar, multisequence MR images of both breasts were obtained
prior to and following the intravenous administration of 7 ml of
Gadavist
Three-dimensional MR images were rendered by post-processing the
original MR data using the DynaCAD thin client. The 3D MR images are
interpreted and the findings are included in the complete MRI report
below.

[Series 1: acess#(phone_number) · axial · 7.0mm · 1.56mm/px · 1 of 25 slices shown]
[im 1/25]
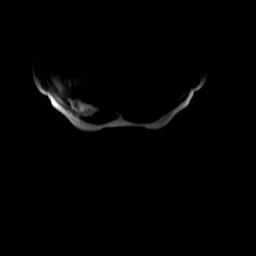

[Series 4: ax ir · axial · 3.0mm · 0.59mm/px · 1 of 65 slices shown]
[im 1/65]
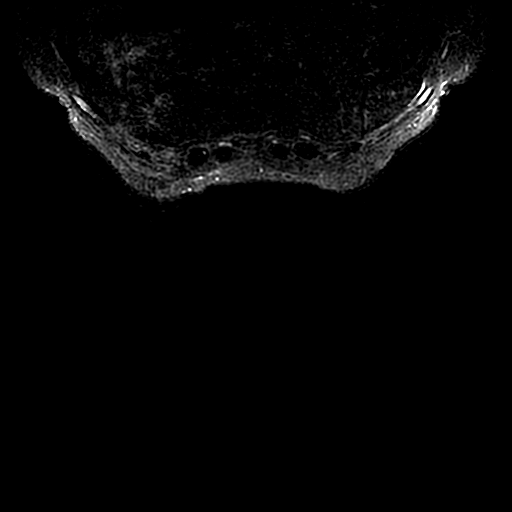

[Series 600: 7ml vibrant mph · axial · 1.8mm · 0.59mm/px · z∈[-82,+112]mm · 5 of 216 slices shown]
[im 1/216]
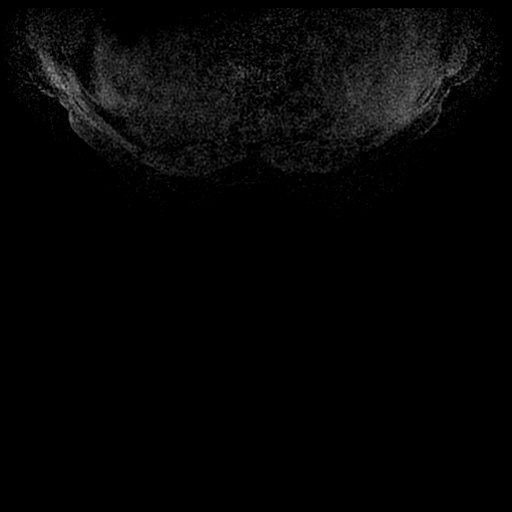
[im 54/216]
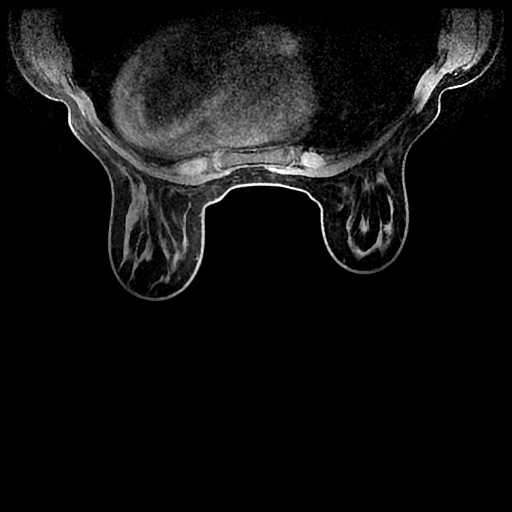
[im 108/216]
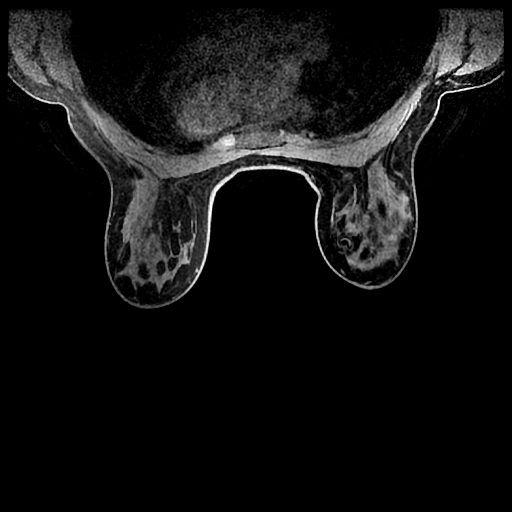
[im 162/216]
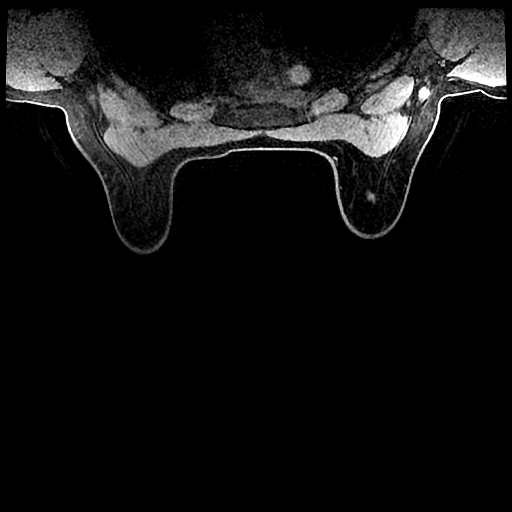
[im 216/216]
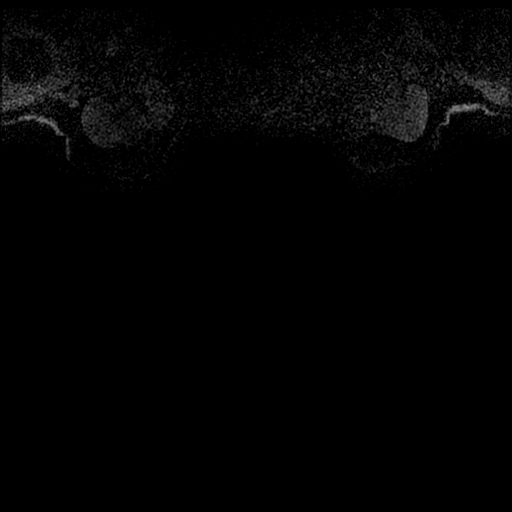

[Series 601: ph1/7ml vibrant mph · axial · 1.8mm · 0.59mm/px · z∈[-82,+112]mm · 5 of 216 slices shown]
[im 1/216]
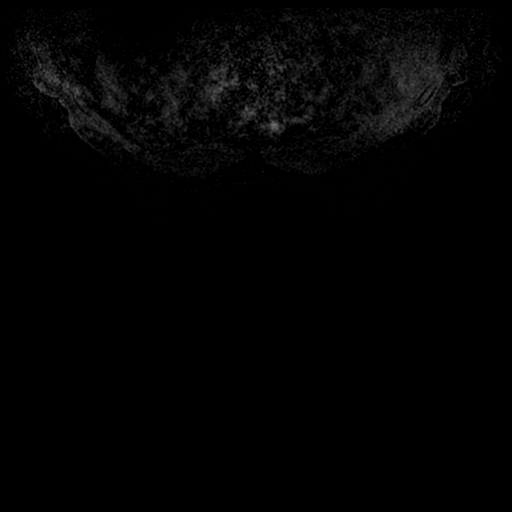
[im 54/216]
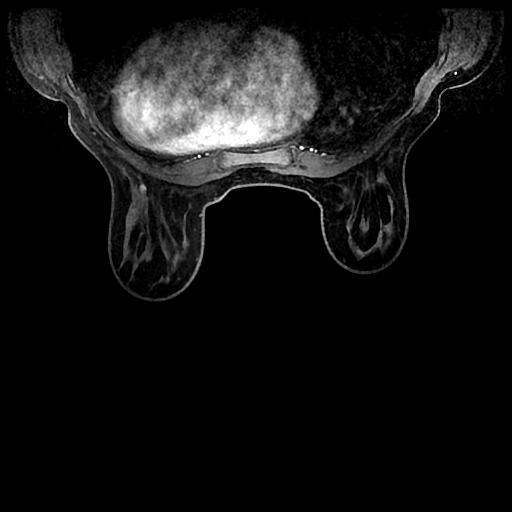
[im 108/216]
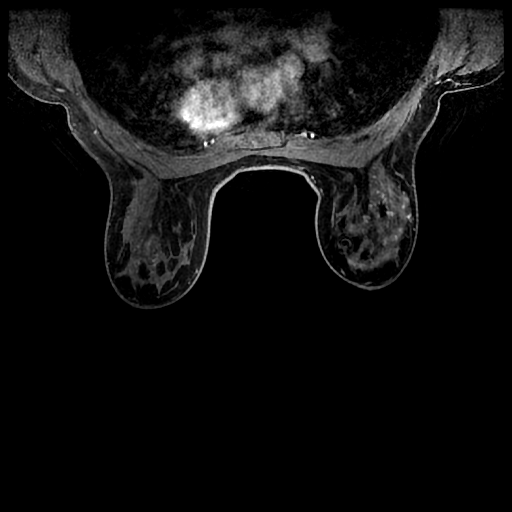
[im 162/216]
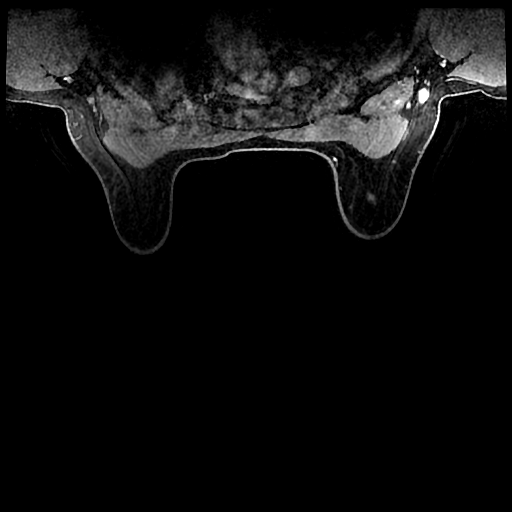
[im 216/216]
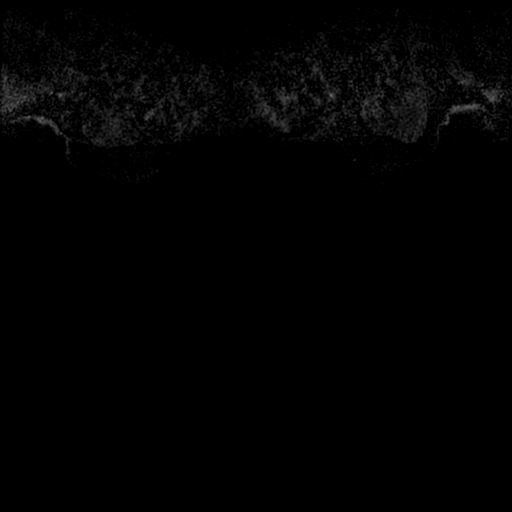

[Series 602: ph2/7ml vibrant mph · axial · 1.8mm · 0.59mm/px · z∈[-82,+112]mm · 5 of 216 slices shown]
[im 1/216]
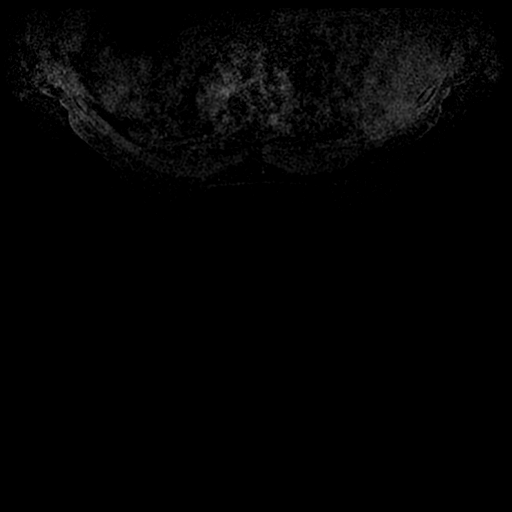
[im 54/216]
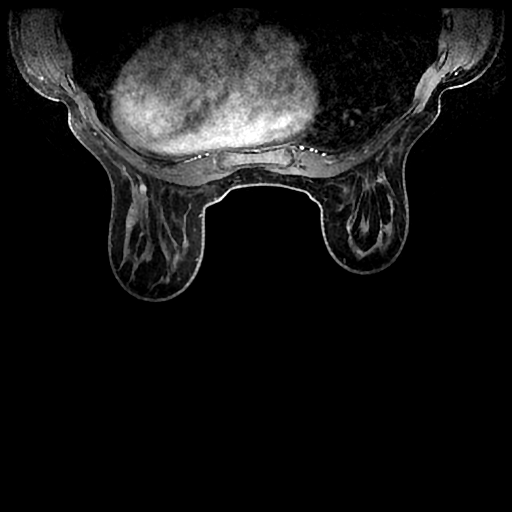
[im 108/216]
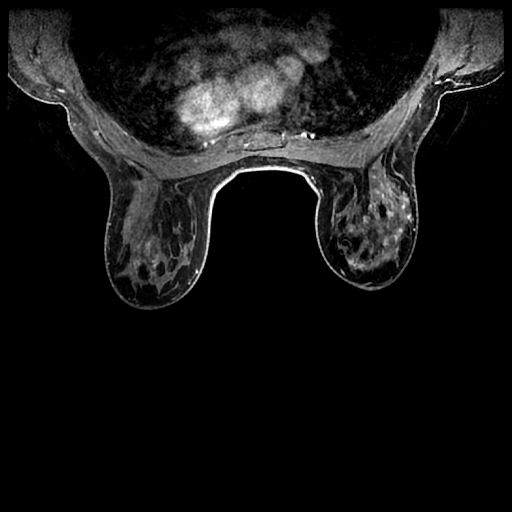
[im 162/216]
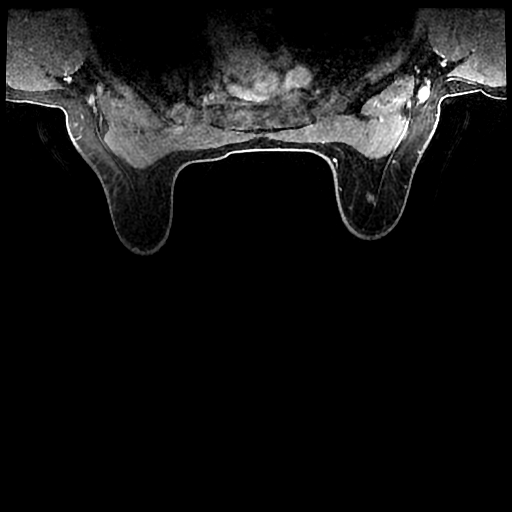
[im 216/216]
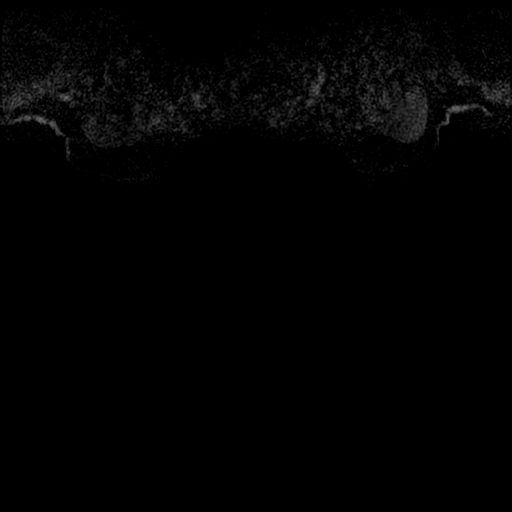

[Series 603: ph3/7ml vibrant mph · axial · 1.8mm · 0.59mm/px · z∈[-82,-4]mm · 3 of 216 slices shown]
[im 1/216]
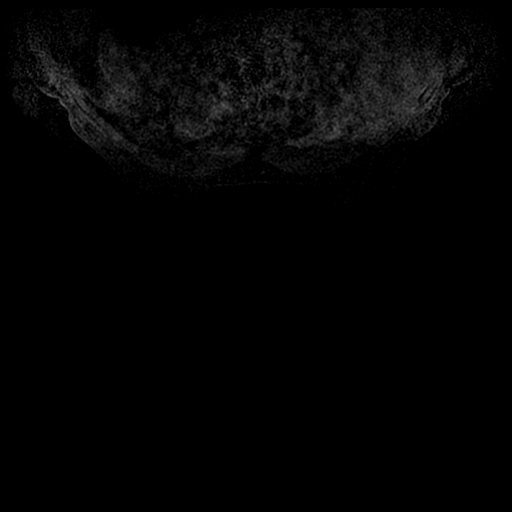
[im 44/216]
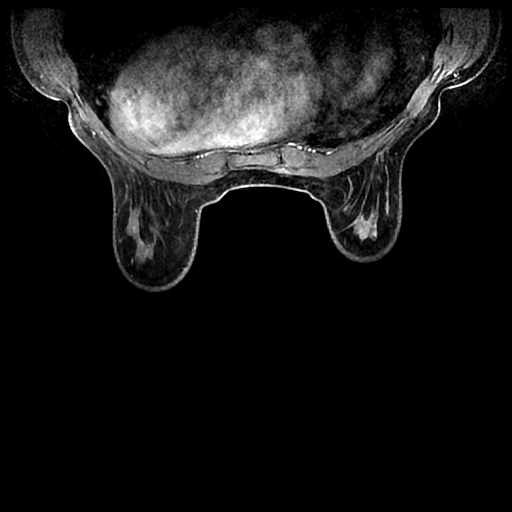
[im 87/216]
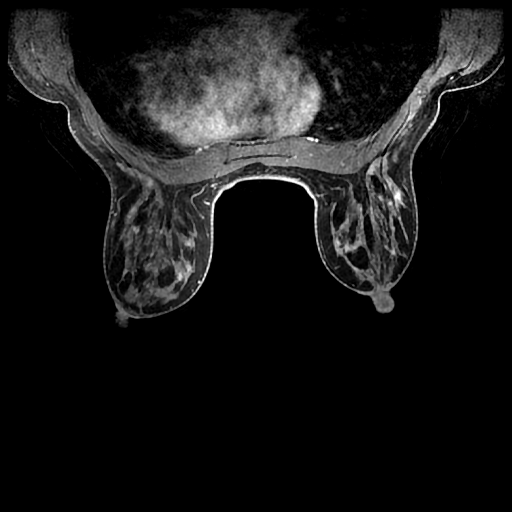

[20 of 48 positions shown; findings below may reference images not displayed]

FINDINGS: Breast composition: c. Heterogeneous fibroglandular tissue.

Background parenchymal enhancement: Moderate.

Right breast: Within the upper inner right breast anterior depth
there is susceptibility artifact from biopsy marking clip. No
suspicious enhancement identified at the level of the biopsy marking
clip. Multiple foci of enhancement demonstrated throughout the right
breast.

Left breast: Multiple foci of enhancement throughout the left
breast.

Lymph nodes: No abnormal appearing lymph nodes.

Ancillary findings:  None.
IMPRESSION: Susceptibility artifact within the upper inner right breast at the
site of recently diagnosed malignancy.

No suspicious enhancement identified within either breast.

RECOMMENDATION:
Treatment plan for known right breast malignancy.

BI-RADS CATEGORY  6: Known biopsy-proven malignancy.

## 2022-09-17 ENCOUNTER — Other Ambulatory Visit: Payer: Self-pay | Admitting: Family Medicine

## 2022-09-17 DIAGNOSIS — Z1231 Encounter for screening mammogram for malignant neoplasm of breast: Secondary | ICD-10-CM

## 2022-10-29 ENCOUNTER — Ambulatory Visit
Admission: RE | Admit: 2022-10-29 | Discharge: 2022-10-29 | Disposition: A | Discharge status: Home / Self Care | Payer: BC Managed Care – PPO | Source: Ambulatory Visit | Attending: Family Medicine | Admitting: Family Medicine

## 2022-10-29 DIAGNOSIS — Z1231 Encounter for screening mammogram for malignant neoplasm of breast: Secondary | ICD-10-CM

## 2022-10-29 HISTORY — DX: Personal history of irradiation: Z92.3

## 2022-10-29 HISTORY — DX: Malignant neoplasm of unspecified site of unspecified female breast: C50.919

## 2023-03-11 ENCOUNTER — Ambulatory Visit (INDEPENDENT_AMBULATORY_CARE_PROVIDER_SITE_OTHER): Payer: BC Managed Care – PPO | Admitting: Orthopaedic Surgery

## 2023-03-11 ENCOUNTER — Other Ambulatory Visit (INDEPENDENT_AMBULATORY_CARE_PROVIDER_SITE_OTHER): Payer: BC Managed Care – PPO

## 2023-03-11 DIAGNOSIS — M1611 Unilateral primary osteoarthritis, right hip: Secondary | ICD-10-CM | POA: Diagnosis not present

## 2023-03-11 DIAGNOSIS — M25551 Pain in right hip: Secondary | ICD-10-CM

## 2023-03-11 NOTE — Progress Notes (Signed)
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  She originally had an x-ray done by one of my colleagues in town and it showed she says a well-maintained joint space.  They did obtain a MRI arthrogram of the right hip and it showed some mild arthritic changes and edema in the femoral head.  She has had a steroid injection.  She has continued to have worsening right hip pain and the steroid injection did help some but it is caused her to have some right knee pain.  She is starting to walk with a limp.  Her husband is with her today.  At this point her right hip pain is definitely affecting her mobility, her quality of life and her actives daily living.  She is otherwise a very healthy 52 year old female with no active medical issues at all.  Examination of her left hip is entirely normal.  Examination of the right hip shows stiffness with rotation with significant pain in the groin with internal and external rotation.  Her right knee exam is normal.  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  The joint space is significantly narrowed and there are osteophytes off of the superior lateral femoral neck.  There are some cystic changes in the superior lateral femoral head neck area.  Her arthritis is now become significant and severe with her right hip.  We talked about hip replacement surgery in detail.  I discussed what to expect from an intraoperative and postoperative standpoint.  We discussed the risk and benefits of the surgery.  We talked about her x-rays and went over her x-rays as well as a hip replacement model.  I did give her handout about hip replacement surgery as well.  We will work on getting this scheduled hopefully in the near future.  All questions concerns were answered and addressed.  Conservative treatment has been tried and it is felt and based on her  clinical exam and x-ray findings this is medically warranted.

## 2023-04-17 ENCOUNTER — Encounter (HOSPITAL_BASED_OUTPATIENT_CLINIC_OR_DEPARTMENT_OTHER): Payer: Self-pay | Admitting: Otolaryngology

## 2023-04-17 ENCOUNTER — Other Ambulatory Visit: Payer: Self-pay | Admitting: Otolaryngology

## 2023-04-17 ENCOUNTER — Other Ambulatory Visit: Payer: Self-pay

## 2023-04-24 ENCOUNTER — Ambulatory Visit (HOSPITAL_BASED_OUTPATIENT_CLINIC_OR_DEPARTMENT_OTHER)
Admission: RE | Admit: 2023-04-24 | Discharge: 2023-04-24 | Disposition: A | Discharge status: Home / Self Care | Payer: BC Managed Care – PPO | Source: Ambulatory Visit | Attending: Otolaryngology | Admitting: Otolaryngology

## 2023-04-24 ENCOUNTER — Encounter (HOSPITAL_BASED_OUTPATIENT_CLINIC_OR_DEPARTMENT_OTHER)
Admission: RE | Disposition: A | Discharge status: Home / Self Care | Payer: Self-pay | Source: Ambulatory Visit | Attending: Otolaryngology

## 2023-04-24 ENCOUNTER — Ambulatory Visit (HOSPITAL_BASED_OUTPATIENT_CLINIC_OR_DEPARTMENT_OTHER): Payer: BC Managed Care – PPO | Admitting: Anesthesiology

## 2023-04-24 ENCOUNTER — Other Ambulatory Visit: Payer: Self-pay

## 2023-04-24 ENCOUNTER — Encounter (HOSPITAL_BASED_OUTPATIENT_CLINIC_OR_DEPARTMENT_OTHER): Payer: Self-pay | Admitting: Otolaryngology

## 2023-04-24 DIAGNOSIS — Z87891 Personal history of nicotine dependence: Secondary | ICD-10-CM | POA: Insufficient documentation

## 2023-04-24 DIAGNOSIS — C44311 Basal cell carcinoma of skin of nose: Secondary | ICD-10-CM

## 2023-04-24 DIAGNOSIS — M1990 Unspecified osteoarthritis, unspecified site: Secondary | ICD-10-CM | POA: Insufficient documentation

## 2023-04-24 DIAGNOSIS — F419 Anxiety disorder, unspecified: Secondary | ICD-10-CM | POA: Insufficient documentation

## 2023-04-24 DIAGNOSIS — M950 Acquired deformity of nose: Secondary | ICD-10-CM | POA: Insufficient documentation

## 2023-04-24 DIAGNOSIS — Z01818 Encounter for other preprocedural examination: Secondary | ICD-10-CM

## 2023-04-24 DIAGNOSIS — Z85828 Personal history of other malignant neoplasm of skin: Secondary | ICD-10-CM | POA: Insufficient documentation

## 2023-04-24 DIAGNOSIS — J45909 Unspecified asthma, uncomplicated: Secondary | ICD-10-CM | POA: Insufficient documentation

## 2023-04-24 DIAGNOSIS — F32A Depression, unspecified: Secondary | ICD-10-CM | POA: Diagnosis not present

## 2023-04-24 DIAGNOSIS — Z428 Encounter for other plastic and reconstructive surgery following medical procedure or healed injury: Secondary | ICD-10-CM | POA: Diagnosis present

## 2023-04-24 HISTORY — PX: EXCISION MASS HEAD: SHX6702

## 2023-04-24 HISTORY — PX: SKIN FULL THICKNESS GRAFT: SHX442

## 2023-04-24 HISTORY — DX: Unspecified asthma, uncomplicated: J45.909

## 2023-04-24 HISTORY — DX: Depression, unspecified: F32.A

## 2023-04-24 SURGERY — EXCISION, MASS, HEAD
Anesthesia: General | Site: Nose | Laterality: Left

## 2023-04-24 MED ORDER — ACETAMINOPHEN 160 MG/5ML PO SOLN: 325.0000 mg | ORAL | Status: DC | PRN

## 2023-04-24 MED ORDER — ONDANSETRON HCL 4 MG/2ML IJ SOLN
INTRAMUSCULAR | Status: AC
  Filled 2023-04-24: qty 2

## 2023-04-24 MED ORDER — ACETAMINOPHEN 325 MG PO TABS: 325.0000 mg | ORAL_TABLET | ORAL | Status: DC | PRN

## 2023-04-24 MED ORDER — BSS IO SOLN
INTRAOCULAR | Status: AC
  Filled 2023-04-24: qty 15

## 2023-04-24 MED ORDER — LIDOCAINE-EPINEPHRINE 1 %-1:100000 IJ SOLN
INTRAMUSCULAR | Status: DC | PRN
  Administered 2023-04-24: 5 mL

## 2023-04-24 MED ORDER — FENTANYL CITRATE (PF) 100 MCG/2ML IJ SOLN
INTRAMUSCULAR | Status: AC
  Filled 2023-04-24: qty 2

## 2023-04-24 MED ORDER — DEXAMETHASONE SODIUM PHOSPHATE 10 MG/ML IJ SOLN
INTRAMUSCULAR | Status: AC
  Filled 2023-04-24: qty 1

## 2023-04-24 MED ORDER — MIDAZOLAM HCL 2 MG/2ML IJ SOLN
INTRAMUSCULAR | Status: AC
  Filled 2023-04-24: qty 2

## 2023-04-24 MED ORDER — FENTANYL CITRATE (PF) 100 MCG/2ML IJ SOLN
INTRAMUSCULAR | Status: DC | PRN
  Administered 2023-04-24: 50 ug via INTRAVENOUS

## 2023-04-24 MED ORDER — CEFAZOLIN SODIUM-DEXTROSE 2-4 GM/100ML-% IV SOLN
2.0000 g | INTRAVENOUS | Status: AC
  Administered 2023-04-24: 2 g via INTRAVENOUS

## 2023-04-24 MED ORDER — ACETAMINOPHEN 10 MG/ML IV SOLN
1000.0000 mg | Freq: Once | INTRAVENOUS | Status: DC | PRN
  Administered 2023-04-24: 1000 mg via INTRAVENOUS

## 2023-04-24 MED ORDER — LIDOCAINE-EPINEPHRINE 1 %-1:100000 IJ SOLN
INTRAMUSCULAR | Status: AC
  Filled 2023-04-24: qty 1

## 2023-04-24 MED ORDER — HYDROCODONE-ACETAMINOPHEN 5-325 MG PO TABS: 1.0000 | ORAL_TABLET | Freq: Four times a day (QID) | ORAL | 0 refills | Status: AC | PRN

## 2023-04-24 MED ORDER — BSS IO SOLN
INTRAOCULAR | Status: DC | PRN
Start: 2023-04-24 — End: 2023-04-24
  Administered 2023-04-24: 15 mL

## 2023-04-24 MED ORDER — PROPOFOL 10 MG/ML IV BOLUS
INTRAVENOUS | Status: DC | PRN
  Administered 2023-04-24: 150 mg via INTRAVENOUS

## 2023-04-24 MED ORDER — DROPERIDOL 2.5 MG/ML IJ SOLN: 0.6250 mg | Freq: Once | INTRAMUSCULAR | Status: DC | PRN

## 2023-04-24 MED ORDER — ONDANSETRON HCL 4 MG/2ML IJ SOLN
INTRAMUSCULAR | Status: DC | PRN
  Administered 2023-04-24: 4 mg via INTRAVENOUS

## 2023-04-24 MED ORDER — BACITRACIN ZINC 500 UNIT/GM EX OINT
TOPICAL_OINTMENT | CUTANEOUS | Status: AC
  Filled 2023-04-24: qty 28.35

## 2023-04-24 MED ORDER — BUPIVACAINE-EPINEPHRINE (PF) 0.25% -1:200000 IJ SOLN
INTRAMUSCULAR | Status: AC
  Filled 2023-04-24: qty 30

## 2023-04-24 MED ORDER — EPHEDRINE SULFATE (PRESSORS) 50 MG/ML IJ SOLN
INTRAMUSCULAR | Status: DC | PRN
  Administered 2023-04-24: 10 mg via INTRAVENOUS

## 2023-04-24 MED ORDER — DEXAMETHASONE SODIUM PHOSPHATE 4 MG/ML IJ SOLN
INTRAMUSCULAR | Status: DC | PRN
  Administered 2023-04-24: 10 mg via INTRAVENOUS

## 2023-04-24 MED ORDER — SUGAMMADEX SODIUM 200 MG/2ML IV SOLN
INTRAVENOUS | Status: DC | PRN
Start: 2023-04-24 — End: 2023-04-24
  Administered 2023-04-24: 200 mg via INTRAVENOUS

## 2023-04-24 MED ORDER — LIDOCAINE HCL (CARDIAC) PF 100 MG/5ML IV SOSY
PREFILLED_SYRINGE | INTRAVENOUS | Status: DC | PRN
  Administered 2023-04-24: 60 mg via INTRAVENOUS

## 2023-04-24 MED ORDER — BACITRACIN ZINC 500 UNIT/GM EX OINT
TOPICAL_OINTMENT | CUTANEOUS | Status: DC | PRN
  Administered 2023-04-24: 1 via TOPICAL

## 2023-04-24 MED ORDER — LACTATED RINGERS IV SOLN: INTRAVENOUS | Status: DC

## 2023-04-24 MED ORDER — ROCURONIUM BROMIDE 10 MG/ML (PF) SYRINGE
PREFILLED_SYRINGE | INTRAVENOUS | Status: AC
  Filled 2023-04-24: qty 10

## 2023-04-24 MED ORDER — ACETAMINOPHEN 10 MG/ML IV SOLN
INTRAVENOUS | Status: AC
  Filled 2023-04-24: qty 100

## 2023-04-24 MED ORDER — PROPOFOL 500 MG/50ML IV EMUL
INTRAVENOUS | Status: AC
  Filled 2023-04-24: qty 50

## 2023-04-24 MED ORDER — ROCURONIUM BROMIDE 100 MG/10ML IV SOLN
INTRAVENOUS | Status: DC | PRN
  Administered 2023-04-24: 50 mg via INTRAVENOUS

## 2023-04-24 MED ORDER — BUPIVACAINE HCL (PF) 0.25 % IJ SOLN
INTRAMUSCULAR | Status: AC
  Filled 2023-04-24: qty 30

## 2023-04-24 MED ORDER — OXYCODONE HCL 5 MG PO TABS: 5.0000 mg | ORAL_TABLET | Freq: Once | ORAL | Status: DC | PRN

## 2023-04-24 MED ORDER — OXYCODONE HCL 5 MG/5ML PO SOLN: 5.0000 mg | Freq: Once | ORAL | Status: DC | PRN

## 2023-04-24 MED ORDER — LIDOCAINE 2% (20 MG/ML) 5 ML SYRINGE
INTRAMUSCULAR | Status: AC
  Filled 2023-04-24: qty 5

## 2023-04-24 MED ORDER — FENTANYL CITRATE (PF) 100 MCG/2ML IJ SOLN
25.0000 ug | INTRAMUSCULAR | Status: DC | PRN
  Administered 2023-04-24 (×2): 25 ug via INTRAVENOUS

## 2023-04-24 MED ORDER — CEFAZOLIN SODIUM-DEXTROSE 2-4 GM/100ML-% IV SOLN
INTRAVENOUS | Status: AC
  Filled 2023-04-24: qty 100

## 2023-04-24 MED ORDER — MIDAZOLAM HCL 5 MG/5ML IJ SOLN
INTRAMUSCULAR | Status: DC | PRN
  Administered 2023-04-24: 2 mg via INTRAVENOUS

## 2023-04-24 SURGICAL SUPPLY — 109 items
APL SKNCLS STERI-STRIP NONHPOA (GAUZE/BANDAGES/DRESSINGS)
APL SRG 3 HI ABS STRL LF PLS (MISCELLANEOUS) ×3
APL SWBSTK 6 STRL LF DISP (MISCELLANEOUS)
APPLICATOR COTTON TIP 6 STRL (MISCELLANEOUS) IMPLANT
APPLICATOR COTTON TIP 6IN STRL (MISCELLANEOUS)
APPLICATOR DR MATTHEWS STRL (MISCELLANEOUS) ×4 IMPLANT
BALL CTTN LRG ABS STRL LF (GAUZE/BANDAGES/DRESSINGS)
BENZOIN TINCTURE PRP APPL 2/3 (GAUZE/BANDAGES/DRESSINGS) IMPLANT
BLADE CLIPPER SURG (BLADE) IMPLANT
BLADE SURG 15 STRL LF DISP TIS (BLADE) ×8 IMPLANT
BLADE SURG 15 STRL SS (BLADE) ×6
BNDG CMPR 5X3 KNIT ELC UNQ LF (GAUZE/BANDAGES/DRESSINGS)
BNDG ELASTIC 3INX 5YD STR LF (GAUZE/BANDAGES/DRESSINGS) IMPLANT
CANISTER SUCT 1200ML W/VALVE (MISCELLANEOUS) ×4 IMPLANT
COAGULATOR SUCT 8FR VV (MISCELLANEOUS) IMPLANT
CORD BIPOLAR FORCEPS 12FT (ELECTRODE) IMPLANT
COTTONBALL LRG STERILE PKG (GAUZE/BANDAGES/DRESSINGS) ×3 IMPLANT
COVER BACK TABLE 60X90IN (DRAPES) ×3 IMPLANT
COVER MAYO STAND STRL (DRAPES) ×3 IMPLANT
DRAPE HEAD BAR (DRAPES) IMPLANT
DRAPE INCISE 23X17 STRL (DRAPES) IMPLANT
DRAPE INCISE IOBAN 23X17 STRL (DRAPES)
DRAPE U-SHAPE 76X120 STRL (DRAPES) ×3 IMPLANT
DRESSING NASAL POPE 10X1.5X2.5 (GAUZE/BANDAGES/DRESSINGS) ×3 IMPLANT
DRSG GLASSCOCK MASTOID ADT (GAUZE/BANDAGES/DRESSINGS) IMPLANT
DRSG GLASSCOCK MASTOID PED (GAUZE/BANDAGES/DRESSINGS) IMPLANT
DRSG NASAL POPE 10X1.5X2.5 (GAUZE/BANDAGES/DRESSINGS)
DRSG TEGADERM 2-3/8X2-3/4 SM (GAUZE/BANDAGES/DRESSINGS) ×8 IMPLANT
DRSG TELFA 3X8 NADH STRL (GAUZE/BANDAGES/DRESSINGS) ×1 IMPLANT
ELECT COATED BLADE 2.86 ST (ELECTRODE) IMPLANT
ELECT NDL BLADE 2-5/6 (NEEDLE) ×3 IMPLANT
ELECT NEEDLE BLADE 2-5/6 (NEEDLE) ×3
ELECT REM PT RETURN 9FT ADLT (ELECTROSURGICAL) ×3
ELECTRODE REM PT RTRN 9FT ADLT (ELECTROSURGICAL) ×3 IMPLANT
FORCEPS BIPOLAR SPETZLER 8 1.0 (NEUROSURGERY SUPPLIES) IMPLANT
GAUZE 4X4 16PLY ~~LOC~~+RFID DBL (SPONGE) IMPLANT
GAUZE PAD ABD 8X10 STRL (GAUZE/BANDAGES/DRESSINGS) IMPLANT
GAUZE SPONGE 2X2 STRL 8-PLY (GAUZE/BANDAGES/DRESSINGS) IMPLANT
GAUZE SPONGE 4X4 12PLY STRL (GAUZE/BANDAGES/DRESSINGS) IMPLANT
GAUZE VASELINE FOILPK 1/2 X 72 (GAUZE/BANDAGES/DRESSINGS) IMPLANT
GAUZE XEROFORM 1X8 LF (GAUZE/BANDAGES/DRESSINGS) IMPLANT
GLOVE BIO SURGEON STRL SZ7.5 (GLOVE) ×4 IMPLANT
GLOVE BIOGEL PI IND STRL 6.5 (GLOVE) ×1 IMPLANT
GLOVE BIOGEL PI IND STRL 7.0 (GLOVE) ×1 IMPLANT
GLOVE BIOGEL PI IND STRL 8 (GLOVE) ×4 IMPLANT
GLOVE SURG SS PI 6.5 STRL IVOR (GLOVE) ×1 IMPLANT
GOWN STRL REUS W/ TWL LRG LVL3 (GOWN DISPOSABLE) ×4 IMPLANT
GOWN STRL REUS W/ TWL XL LVL3 (GOWN DISPOSABLE) ×4 IMPLANT
GOWN STRL REUS W/TWL LRG LVL3 (GOWN DISPOSABLE) ×3
GOWN STRL REUS W/TWL XL LVL3 (GOWN DISPOSABLE) ×3
HEMOSTAT SURGICEL .5X2 ABSORB (HEMOSTASIS) IMPLANT
MARKER SKIN DUAL TIP RULER LAB (MISCELLANEOUS) ×1 IMPLANT
NDL HYPO 22X1.5 SAFETY MO (MISCELLANEOUS) IMPLANT
NDL HYPO 25X1 1.5 SAFETY (NEEDLE) IMPLANT
NDL HYPO 27GX1-1/4 (NEEDLE) ×3 IMPLANT
NDL HYPO 30GX1 BEV (NEEDLE) ×3 IMPLANT
NEEDLE HYPO 22X1.5 SAFETY MO (MISCELLANEOUS)
NEEDLE HYPO 25X1 1.5 SAFETY (NEEDLE)
NEEDLE HYPO 27GX1-1/4 (NEEDLE) ×3
NEEDLE HYPO 30GX1 BEV (NEEDLE)
NS IRRIG 1000ML POUR BTL (IV SOLUTION) ×4 IMPLANT
PACK BASIN DAY SURGERY FS (CUSTOM PROCEDURE TRAY) ×4 IMPLANT
PACK ENT DAY SURGERY (CUSTOM PROCEDURE TRAY) ×4 IMPLANT
PAD ALCOHOL SWAB (MISCELLANEOUS) IMPLANT
PENCIL SMOKE EVACUATOR (MISCELLANEOUS) ×4 IMPLANT
SHEET MEDIUM DRAPE 40X70 STRL (DRAPES) IMPLANT
SHEILD EYE MED CORNL SHD 22X21 (OPHTHALMIC RELATED)
SHIELD EYE MED CORNL SHD 22X21 (OPHTHALMIC RELATED) IMPLANT
SLEEVE SCD COMPRESS KNEE MED (STOCKING) ×4 IMPLANT
SPIKE FLUID TRANSFER (MISCELLANEOUS) IMPLANT
SPLINT NASAL DENVER LRG BLUSH (MISCELLANEOUS) IMPLANT
SPLINT NASAL DENVER SM/MD BLUS (MISCELLANEOUS) IMPLANT
SPONGE SURGIFOAM ABS GEL 12-7 (HEMOSTASIS) IMPLANT
SPONGE T-LAP 18X18 ~~LOC~~+RFID (SPONGE) ×3 IMPLANT
STAPLER VISISTAT 35W (STAPLE) IMPLANT
STRIP CLOSURE SKIN 1/2X4 (GAUZE/BANDAGES/DRESSINGS) IMPLANT
STRIP CLOSURE SKIN 1/4X4 (GAUZE/BANDAGES/DRESSINGS) IMPLANT
SUCTION TUBE FRAZIER 10FR DISP (SUCTIONS) ×4 IMPLANT
SUT CHROMIC 4 0 P 3 18 (SUTURE) IMPLANT
SUT CHROMIC 5 0 P 3 (SUTURE) ×4 IMPLANT
SUT ETHILON 4 0 CL P 3 (SUTURE) IMPLANT
SUT ETHILON 5 0 PS 2 18 (SUTURE) IMPLANT
SUT ETHILON 6 0 P 1 (SUTURE) ×1 IMPLANT
SUT ETHILON 6 0 PS 3 18 (SUTURE) IMPLANT
SUT MNCRL 6-0 UNDY P1 1X18 (SUTURE) IMPLANT
SUT MNCRL AB 4-0 PS2 18 (SUTURE) IMPLANT
SUT MON AB 4-0 PC3 18 (SUTURE) ×4 IMPLANT
SUT MON AB 5-0 P3 18 (SUTURE) IMPLANT
SUT NYLON ETHILON 5-0 P-3 1X18 (SUTURE) IMPLANT
SUT PDS AB 4-0 P3 18 (SUTURE) IMPLANT
SUT PLAIN 5 0 P 3 18 (SUTURE) IMPLANT
SUT PLAIN 6 0 TG1408 (SUTURE) IMPLANT
SUT PLAIN GUT FAST 5-0 (SUTURE) ×1 IMPLANT
SUT SILK 4 0 PS 2 (SUTURE) ×3 IMPLANT
SUT VIC AB 3-0 X1 27 (SUTURE) IMPLANT
SUT VIC AB 4-0 PS2 27 (SUTURE) IMPLANT
SYR 10ML LL (SYRINGE) IMPLANT
SYR 3ML 23GX1 SAFETY (SYRINGE) ×3 IMPLANT
SYR BULB EAR ULCER 3OZ GRN STR (SYRINGE) ×1 IMPLANT
SYR CONTROL 10ML LL (SYRINGE) ×3 IMPLANT
SYR TB 1ML LL NO SAFETY (SYRINGE) IMPLANT
TAPE PAPER 1/2X10 TAN MEDIPORE (MISCELLANEOUS) IMPLANT
TAPE UMBILICAL 1/8 X36 TWILL (MISCELLANEOUS) IMPLANT
TOWEL GREEN STERILE FF (TOWEL DISPOSABLE) ×8 IMPLANT
TRAY DSU PREP LF (CUSTOM PROCEDURE TRAY) ×4 IMPLANT
TUBE CONNECTING 20X1/4 (TUBING) ×3 IMPLANT
TUBE SALEM SUMP 16F (TUBING) IMPLANT
UNDERPAD 30X36 HEAVY ABSORB (UNDERPADS AND DIAPERS) ×3 IMPLANT
YANKAUER SUCT BULB TIP NO VENT (SUCTIONS) IMPLANT

## 2023-04-24 NOTE — Anesthesia Preprocedure Evaluation (Addendum)
Anesthesia Evaluation  Patient identified by MRN, date of birth, ID band Patient awake    Reviewed: Allergy & Precautions, NPO status , Patient's Chart, lab work & pertinent test results  Airway Mallampati: I  TM Distance: >3 FB Neck ROM: Full    Dental  (+) Teeth Intact, Dental Advisory Given   Pulmonary asthma , former smoker   breath sounds clear to auscultation       Cardiovascular negative cardio ROS  Rhythm:Regular Rate:Normal     Neuro/Psych  PSYCHIATRIC DISORDERS Anxiety Depression       GI/Hepatic negative GI ROS, Neg liver ROS,,,  Endo/Other  negative endocrine ROS    Renal/GU negative Renal ROS     Musculoskeletal  (+) Arthritis ,    Abdominal   Peds  Hematology  (+) Blood dyscrasia, anemia   Anesthesia Other Findings   Reproductive/Obstetrics                             Anesthesia Physical Anesthesia Plan  ASA: 2  Anesthesia Plan: General   Post-op Pain Management: Tylenol PO (pre-op)*   Induction: Intravenous  PONV Risk Score and Plan: 4 or greater and Ondansetron, Dexamethasone, Midazolam and Scopolamine patch - Pre-op  Airway Management Planned: Oral ETT  Additional Equipment: None  Intra-op Plan:   Post-operative Plan: Extubation in OR  Informed Consent: I have reviewed the patients History and Physical, chart, labs and discussed the procedure including the risks, benefits and alternatives for the proposed anesthesia with the patient or authorized representative who has indicated his/her understanding and acceptance.     Dental advisory given  Plan Discussed with: CRNA  Anesthesia Plan Comments:        Anesthesia Quick Evaluation

## 2023-04-24 NOTE — Anesthesia Procedure Notes (Signed)
Procedure Name: Intubation Date/Time: 04/24/2023 8:47 AM  Performed by: Burna Cash, CRNAPre-anesthesia Checklist: Patient identified, Emergency Drugs available, Suction available and Patient being monitored Patient Re-evaluated:Patient Re-evaluated prior to induction Oxygen Delivery Method: Circle system utilized Preoxygenation: Pre-oxygenation with 100% oxygen Induction Type: IV induction Ventilation: Mask ventilation without difficulty Laryngoscope Size: Mac and 3 Grade View: Grade I Tube type: Oral Tube size: 7.0 mm Number of attempts: 1 Airway Equipment and Method: Stylet and Oral airway Placement Confirmation: ETT inserted through vocal cords under direct vision, positive ETCO2 and breath sounds checked- equal and bilateral Secured at: 21 cm Tube secured with: Tape Dental Injury: Teeth and Oropharynx as per pre-operative assessment

## 2023-04-24 NOTE — H&P (Signed)
Ann Patton is an 52 y.o. female.    Chief Complaint:  Mohs defect of the nose  HPI: Patient presents today for planned elective procedure.  He/she denies any interval change in history since office visit on 03/04/23.  Past Medical History:  Diagnosis Date   Anemia    Anxiety    Asthma    mild   Breast cancer (HCC)    Depression    Family history of breast cancer    Family history of leukemia    Family history of lung cancer    Family history of melanoma    Family history of ovarian cancer    Personal history of radiation therapy     Past Surgical History:  Procedure Laterality Date   BREAST BIOPSY Right 2019   BREAST LUMPECTOMY Right 2019   BREAST LUMPECTOMY WITH RADIOACTIVE SEED LOCALIZATION Right 04/09/2018   Procedure: RIGHT BREAST LUMPECTOMY WITH RADIOACTIVE SEED LOCALIZATION;  Surgeon: Glenna Fellows, MD;  Location: [facility];  Service: General;  Laterality: Right;   c sections     TUBAL LIGATION      Family History  Problem Relation Age of Onset   Hypertension Mother    Ovarian cancer Mother 26   Lung cancer Mother 47   Skin cancer Father        Melanoma   Breast cancer Paternal Grandmother        dx 19s, recurrence in 90s   Lung cancer Maternal Aunt        d. 66s   Leukemia Paternal Aunt    Skin cancer Maternal Grandmother     Social History:  reports that she has quit smoking. Her smoking use included cigarettes. She has never used smokeless tobacco. She reports current alcohol use of about 14.0 standard drinks of alcohol per week. She reports that she does not use drugs.  Allergies: No Known Allergies  Medications Prior to Admission  Medication Sig Dispense Refill   buPROPion (WELLBUTRIN) 75 MG tablet Take 75 mg by mouth 2 (two) times daily.     celecoxib (CELEBREX) 100 MG capsule Take 100 mg by mouth 2 (two) times daily.     Collagen-Vitamin C-Biotin (COLLAGEN 1500/C PO) Take by mouth.     Multiple Vitamin (MULTIVITAMIN)  tablet Take 1 tablet by mouth daily.     Turmeric 500 MG CAPS Take by mouth.     albuterol (PROVENTIL HFA;VENTOLIN HFA) 108 (90 Base) MCG/ACT inhaler Inhale into the lungs every 6 (six) hours as needed for wheezing or shortness of breath.      No results found for this or any previous visit (from the past 48 hour(s)). No results found.  ROS: negative other than stated in HPI  Blood pressure 120/77, pulse 72, temperature (!) 97.3 F (36.3 C), temperature source Temporal, resp. rate 16, height 5\' 3"  (1.6 m), weight 74.6 kg, last menstrual period 03/13/2018, SpO2 100%.  PHYSICAL EXAM: General: Resting comfortably in NAD  Lungs: Non-labored respiratinos  Studies Reviewed: none   Assessment/Plan Mohs defect of the nose Basal cell carcinoma of the nose  Proceed with repair mohs defect of the nose with full thickness skin graft from either ear/cheek or forehead (patient prefers left forehead).  Inforemed consent obtained. RBA discussed.    Electronically signed by:  Scarlette Ar, MD  Staff Physician Facial Plastic & Reconstructive Surgery Otolaryngology - Head and Neck Surgery [facility]  04/24/2023, 8:29 AM

## 2023-04-24 NOTE — Discharge Instructions (Addendum)
Post-operative Patient Instructions Ann Patton. Hoshal MD  Surgery What to expect: A bandage will be placed on your surgical sites. You can leave the bandages in place until you return to the clinic. You may be scheduled for a series of wound care appointments over the next month.  Recovery/Restrictions: -No strenuous activity for at least the first week after your procedure -Bruising and swelling are expected and will take weeks to go away (consider using ice packs) -Please contact our office immediately if you experience any signs/symptoms of infection (redness, pain, or fever of 100.74F or greater)  Wound Wound care: The goal is to keep your wounds clean and moist to prevent scabs or crusts. You will keep your current post-operative dressing in place undisturbed until after your first post-operative clinic visit. The following instructions apply after your first post-operative visit.  1. Clean wound wounds with soap and water using a cue tip if any crusts are present  2. Next, apply a thin layer of Aquaphor ointment 3. Apply Telfa and cover with brown tape  4. If you had an ear surgery - please apply a thin layer of antibiotic ointment or Aquaphor ointment to your ear incision every day  Care Healing Period: For the best healing, please protect the area from the sun   You may be asked to begin massaging the scars several weeks after surgery. Scars can be massaged in horizontal, vertical, and circular motions.   Adult Post-Operative Pain Management  Pain medication is given immediately following your surgery to help with post-operative pain. Do not wake up or set an alarm to wake up and take pain medications. Sleep and rest.  Upon your discharge home, we suggest scheduled doses of Acetaminophen (Tylenol) every 6 hours and Ibuprofen (Motrin) every 6 hours, alternating between medications every 3 hours (i.e. Take Tylenol and wait 3 hours, then take Motrin and wait 3 hours, repeat) for the  first 3-4 days after surgery. If you are without significant pain, medications can be taken more infrequently. It is important to follow dosing instructions on the medication bottle or prescription.   Sample of medication dosing schedule  Give dose of: Time: Given:  Acetaminophen 12 a.m.   Ibuprofen 3 a.m.   Acetaminophen 6 a.m.   Ibuprofen 9 a.m.   Acetaminophen 12 p.m.   Ibuprofen 3 p.m.   Acetaminophen 6 p.m.   Ibuprofen 9 p.m.    If you need to call after clinic hours for a concern, call [phone] and ask for the "physician on call for ENT."  [address] [place], [place] [address] Phone: [phone]       Post Anesthesia Home Care Instructions  Activity: Get plenty of rest for the remainder of the day. A responsible individual must stay with you for 24 hours following the procedure.  For the next 24 hours, DO NOT: -Drive a car -Advertising copywriter -Drink alcoholic beverages -Take any medication unless instructed by your physician -Make any legal decisions or sign important papers.  Meals: Start with liquid foods such as gelatin or soup. Progress to regular foods as tolerated. Avoid greasy, spicy, heavy foods. If nausea and/or vomiting occur, drink only clear liquids until the nausea and/or vomiting subsides. Call your physician if vomiting continues.  Special Instructions/Symptoms: Your throat may feel dry or sore from the anesthesia or the breathing tube placed in your throat during surgery. If this causes discomfort, gargle with warm salt water. The discomfort should disappear within 24 hours.  If  you had a scopolamine patch placed behind your ear for the management of post- operative nausea and/or vomiting:  1. The medication in the patch is effective for 72 hours, after which it should be removed.  Wrap patch in a tissue and discard in the trash. Wash hands thoroughly with soap and water. 2. You may remove the patch earlier than 72 hours if you  experience unpleasant side effects which may include dry mouth, dizziness or visual disturbances. 3. Avoid touching the patch. Wash your hands with soap and water after contact with the patch.  No tylenol until after 4:30pm today if needed.

## 2023-04-24 NOTE — Transfer of Care (Signed)
Immediate Anesthesia Transfer of Care Note  Patient: Ann Patton  Procedure(s) Performed: EXCISION OF MOHS DEFECT OF NOSE WITH REPAIR AND RECONSTRUCTION (Bilateral: Nose) SKIN GRAFT FROM LEFT EAR (Left: Ear)  Patient Location: PACU  Anesthesia Type:General  Level of Consciousness: awake, alert , and oriented  Airway & Oxygen Therapy: Patient Spontanous Breathing and Patient connected to face mask oxygen  Post-op Assessment: Report given to RN and Post -op Vital signs reviewed and stable  Post vital signs: Reviewed and stable  Last Vitals:  Vitals Value Taken Time  BP 136/81 04/24/23 1001  Temp    Pulse 101 04/24/23 1004  Resp 10 04/24/23 1004  SpO2 96 % 04/24/23 1004  Vitals shown include unfiled device data.  Last Pain:  Vitals:   04/24/23 0719  TempSrc: Temporal  PainSc: 0-No pain         Complications: No notable events documented.

## 2023-04-24 NOTE — Op Note (Signed)
OPERATIVE NOTE  Ann Patton Date/Time of Admission: 04/24/2023  6:56 AM  CSN: [id number];ONG:[id number] Attending Provider: Scarlette Ar, MD Room/Bed: [facility]SP/NONE DOB: 1971/01/13 Age: 52 y.o.   Pre-Op Diagnosis: Basal cell carcinoma of skin of nose; Acquired nasal deformity  Post-Op Diagnosis: Basal cell carcinoma of skin of nose; Acquired nasal deformity  Procedure:  Repair mohs defect of the nose with full thickness skin graft (3x1cm) harvest from left cheek (CPT 15240) Excision wound of head <100cm2 in preparation for graft (CPT 15004)  Anesthesia: General  Surgeon(s): Mervin Kung, MD  Staff: Circulator: Lenn Cal, RN; Janace Aris, RN Scrub Person: Rolla Etienne  Implants: * No implants in log *  Specimens: * No specimens in log *  Complications: none  EBL: minimal ML  IVF: Per anesthesia ML  Condition: stable  Operative Findings:  11x54mm mohs defect of nasal tip with preservation of underlying perichondrium   Photo:    Description of Operation:  The patient was identified in the preoperative area and consent confirmed in the chart.  She was brought to the operating room by the anesthetist and a preoperative huddle was performed confirming the patient's identity and procedure to be performed.  Once all were in agreement we proceeded with surgery.  General anesthesia was induced and the patient was intubated with a ETT. The patient's bandages were removed demonstrating the findings as noted above. Local anesthesia infiltrated with lidocaine and epinephrine.   The patient was prepped and draped in standard fashion for procedure of this kind.  Final preoperative pause was performed and we proceeded with surgery.   We began with excision Mohs defect wound debridement of for skin graft. The sub-smas plane was dissected sharply overlying the nasal tip cartilages and wound edges debrided. Hemostasis was achieved and we proceeded with  full-thickness skin graft harvest from left cheek pre-auricular crease.     A template marking out approximately 11 mm defect nasal tip was transposed to the left cheek pre-auricular crease 3 x 1 cm full-thickness skin graft was then harvested with a 15 blade.  The Skin graft was subsequently defatted to the dermal layer sharply.  A subcutaneous flap was dissected with fine tenotomy scissors approximately 2-3 cm  to allow for tension-free closure.  Hemostasis was confirmed and the wound was closed with buried interrupted 4-0 monocryl suture and interrupted 6-0 nylon sutures.   Next the full-thickness skin graft was then inset into the left ear Mohs defect using interrupted 5-0 chromic and 5-0 fast gut sutures.  1 separate tacking stitches was placed to the deep perichondrium and and fascia. The wounds were all cleansed, dressed with bacitracin, Telfa, brown paper tape.  The patient was turned back to the anesthetist who extubated and brought her to the recovery room in stable condition.      Mervin Kung, MD [facility]  04/24/2023

## 2023-04-24 NOTE — Anesthesia Postprocedure Evaluation (Signed)
Anesthesia Post Note  Patient: Ann Patton  Procedure(s) Performed: EXCISION OF MOHS DEFECT OF NOSE WITH REPAIR AND RECONSTRUCTION (Bilateral: Nose) SKIN GRAFT FROM LEFT EAR (Left: Ear)     Patient location during evaluation: PACU Anesthesia Type: General Level of consciousness: awake and alert Pain management: pain level controlled Vital Signs Assessment: post-procedure vital signs reviewed and stable Respiratory status: spontaneous breathing, nonlabored ventilation, respiratory function stable and patient connected to nasal cannula oxygen Cardiovascular status: blood pressure returned to baseline and stable Postop Assessment: no apparent nausea or vomiting Anesthetic complications: no  No notable events documented.  Last Vitals:  Vitals:   04/24/23 1045 04/24/23 1059  BP: 130/78 (!) 143/79  Pulse: 80 73  Resp: 10 16  Temp:  (!) 36.3 C  SpO2: 97% 96%    Last Pain:  Vitals:   04/24/23 1059  TempSrc:   PainSc: 1                  Raelie Lohr L Merla Sawka

## 2023-04-25 ENCOUNTER — Encounter (HOSPITAL_BASED_OUTPATIENT_CLINIC_OR_DEPARTMENT_OTHER): Payer: Self-pay | Admitting: Otolaryngology

## 2023-04-29 ENCOUNTER — Encounter: Payer: Self-pay | Admitting: Orthopaedic Surgery

## 2023-04-29 NOTE — Telephone Encounter (Signed)
Please address question about flying.  Thanks!

## 2023-06-10 ENCOUNTER — Encounter: Payer: BC Managed Care – PPO | Admitting: Orthopaedic Surgery

## 2023-06-18 NOTE — Progress Notes (Signed)
Surgical Instructions   Your procedure is scheduled on Tuesday, June 25, 2023. Report to [facility] Main Entrance "A" at 5:30 A.M., then check in with the Admitting office. Any questions or running late day of surgery: call [phone]  Questions prior to your surgery date: call [phone], Monday-Friday, 8am-4pm. If you experience any cold or flu symptoms such as cough, fever, chills, shortness of breath, etc. between now and your scheduled surgery, please notify us at the above number.     Remember:  Do not eat after midnight the night before your surgery  You may drink clear liquids until 4:30 the morning of your surgery.   Clear liquids allowed are: Water, Non-Citrus Juices (without pulp), Carbonated Beverages, Clear Tea (no milk, honey, etc.), Black Coffee Only (NO MILK, CREAM OR POWDERED CREAMER of any kind), and Gatorade.  Patient Instructions  The night before surgery:  No food after midnight. ONLY clear liquids after midnight  The day of surgery (if you do NOT have diabetes):  Drink ONE (1) Pre-Surgery Clear Ensure by 4:30 the morning of surgery. Drink in one sitting. Do not sip.  This drink was given to you during your hospital  pre-op appointment visit.  Nothing else to drink after completing the  Pre-Surgery Clear Ensure.         If you have questions, please contact your surgeon's office.    Take these medicines the morning of surgery with A SIP OF WATER  buPROPion [facility] Iv)   May take these medicines IF NEEDED: albuterol (PROVENTIL HFA;VENTOLIN HFA) 108 (90 Base) MCG/ACT inhaler. Please bring inhaler with you to the hospital.     One week prior to surgery, STOP taking any Aspirin (unless otherwise instructed by your surgeon) Aleve, Naproxen, Ibuprofen, Motrin, Advil, Goody's, BC's, all herbal medications, fish oil, and non-prescription vitamins.  This includes your celecoxib (CELEBREX).                        Do NOT Smoke (Tobacco/Vaping) for 24  hours prior to your procedure.  If you use a CPAP at night, you may bring your mask/headgear for your overnight stay.   You will be asked to remove any contacts, glasses, piercing's, hearing aid's, dentures/partials prior to surgery. Please bring cases for these items if needed.    Patients discharged the day of surgery will not be allowed to drive home, and someone needs to stay with them for 24 hours.  SURGICAL WAITING ROOM VISITATION Patients may have no more than 2 support people in the waiting area - these visitors may rotate.   Pre-op nurse will coordinate an appropriate time for 1 ADULT support person, who may not rotate, to accompany patient in pre-op.  Children under the age of 73 must have an adult with them who is not the patient and must remain in the main waiting area with an adult.  If the patient needs to stay at the hospital during part of their recovery, the visitor guidelines for inpatient rooms apply.  Please refer to the [facility] website for the visitor guidelines for any additional information.   If you received a COVID test during your pre-op visit  it is requested that you wear a mask when out in public, stay away from anyone that may not be feeling well and notify your surgeon if you develop symptoms. If you have been in contact with anyone that has tested positive in the last 10 days please notify you surgeon.  Pre-operative 5 CHG Bathing Instructions   You can play a key role in reducing the risk of infection after surgery. Your skin needs to be as free of germs as possible. You can reduce the number of germs on your skin by washing with CHG (chlorhexidine gluconate) soap before surgery. CHG is an antiseptic soap that kills germs and continues to kill germs even after washing.   DO NOT use if you have an allergy to chlorhexidine/CHG or antibacterial soaps. If your skin becomes reddened or irritated, stop using the CHG and notify one of our RNs at  [phone].   Please shower with the CHG soap starting 4 days before surgery using the following schedule:     Please keep in mind the following:  DO NOT shave, including legs and underarms, starting the day of your first shower.   You may shave your face at any point before/day of surgery.  Place clean sheets on your bed the day you start using CHG soap. Use a clean washcloth (not used since being washed) for each shower. DO NOT sleep with pets once you start using the CHG.   CHG Shower Instructions:  Wash your face and private area with normal soap. If you choose to wash your hair, wash first with your normal shampoo.  After you use shampoo/soap, rinse your hair and body thoroughly to remove shampoo/soap residue.  Turn the water OFF and apply about 3 tablespoons (45 ml) of CHG soap to a CLEAN washcloth.  Apply CHG soap ONLY FROM YOUR NECK DOWN TO YOUR TOES (washing for 3-5 minutes)  DO NOT use CHG soap on face, private areas, open wounds, or sores.  Pay special attention to the area where your surgery is being performed.  If you are having back surgery, having someone wash your back for you may be helpful. Wait 2 minutes after CHG soap is applied, then you may rinse off the CHG soap.  Pat dry with a clean towel  Put on clean clothes/pajamas   If you choose to wear lotion, please use ONLY the CHG-compatible lotions on the back of this paper.   Additional instructions for the day of surgery: DO NOT APPLY any lotions, deodorants, or perfumes.   Do not bring valuables to the hospital. [facility] is not responsible for any belongings/valuables. Do not wear nail polish, gel polish, artificial nails, or any other type of covering on natural nails (fingers and toes) Do not wear jewelry or makeup Put on clean/comfortable clothes.  Please brush your teeth.  Ask your nurse before applying any prescription medications to the skin.     CHG Compatible Lotions   Aveeno Moisturizing  lotion  Cetaphil Moisturizing Cream  Cetaphil Moisturizing Lotion  Clairol Herbal Essence Moisturizing Lotion, Dry Skin  Clairol Herbal Essence Moisturizing Lotion, Extra Dry Skin  Clairol Herbal Essence Moisturizing Lotion, Normal Skin  Curel Age Defying Therapeutic Moisturizing Lotion with Alpha Hydroxy  Curel Extreme Care Body Lotion  Curel Soothing Hands Moisturizing Hand Lotion  Curel Therapeutic Moisturizing Cream, Fragrance-Free  Curel Therapeutic Moisturizing Lotion, Fragrance-Free  Curel Therapeutic Moisturizing Lotion, Original Formula  Eucerin Daily Replenishing Lotion  Eucerin Dry Skin Therapy Plus Alpha Hydroxy Crme  Eucerin Dry Skin Therapy Plus Alpha Hydroxy Lotion  Eucerin Original Crme  Eucerin Original Lotion  Eucerin Plus Crme Eucerin Plus Lotion  Eucerin TriLipid Replenishing Lotion  Keri Anti-Bacterial Hand Lotion  Keri Deep Conditioning Original Lotion Dry Skin Formula Softly Scented  Keri Deep Conditioning Original Lotion, Fragrance  Free Sensitive Skin Formula  Keri Lotion Fast Absorbing Fragrance Free Sensitive Skin Formula  Keri Lotion Fast Absorbing Softly Scented Dry Skin Formula  Keri Original Lotion  Keri Skin Renewal Lotion Keri Silky Smooth Lotion  Keri Silky Smooth Sensitive Skin Lotion  Nivea Body Creamy Conditioning Oil  Nivea Body Extra Enriched Lotion  Nivea Body Original Lotion  Nivea Body Sheer Moisturizing Lotion Nivea Crme  Nivea Skin Firming Lotion  NutraDerm 30 Skin Lotion  NutraDerm Skin Lotion  NutraDerm Therapeutic Skin Cream  NutraDerm Therapeutic Skin Lotion  ProShield Protective Hand Cream  Provon moisturizing lotion  Please read over the following fact sheets that you were given.

## 2023-06-19 ENCOUNTER — Encounter (HOSPITAL_COMMUNITY): Payer: Self-pay

## 2023-06-19 ENCOUNTER — Encounter (HOSPITAL_COMMUNITY)
Admission: RE | Admit: 2023-06-19 | Discharge: 2023-06-19 | Disposition: A | Discharge status: Home / Self Care | Payer: BC Managed Care – PPO | Source: Ambulatory Visit | Attending: Orthopaedic Surgery | Admitting: Orthopaedic Surgery

## 2023-06-19 ENCOUNTER — Other Ambulatory Visit: Payer: Self-pay

## 2023-06-19 VITALS — BP 136/85 | HR 82 | Temp 98.3°F | Resp 17 | Ht 63.0 in | Wt 162.0 lb

## 2023-06-19 DIAGNOSIS — Z01818 Encounter for other preprocedural examination: Secondary | ICD-10-CM

## 2023-06-19 DIAGNOSIS — M1611 Unilateral primary osteoarthritis, right hip: Secondary | ICD-10-CM

## 2023-06-19 DIAGNOSIS — Z01812 Encounter for preprocedural laboratory examination: Secondary | ICD-10-CM | POA: Diagnosis present

## 2023-06-19 HISTORY — DX: Unspecified malignant neoplasm of skin, unspecified: C44.90

## 2023-06-19 HISTORY — DX: Unspecified osteoarthritis, unspecified site: M19.90

## 2023-06-19 LAB — BASIC METABOLIC PANEL
Anion gap: 8 (ref 5–15)
BUN: 11 mg/dL (ref 6–20)
CO2: 26 mmol/L (ref 22–32)
Calcium: 9.3 mg/dL (ref 8.9–10.3)
Chloride: 103 mmol/L (ref 98–111)
Creatinine, Ser: 0.89 mg/dL (ref 0.44–1.00)
GFR, Estimated: >60 mL/min (ref >60)
Glucose, Bld: 107 mg/dL — ABNORMAL HIGH (ref 70–99)
Potassium: 3.9 mmol/L (ref 3.5–5.1)
Sodium: 137 mmol/L (ref 135–145)

## 2023-06-19 LAB — CBC
HCT: 36.6 % (ref 36.0–46.0)
Hemoglobin: 11.3 g/dL — ABNORMAL LOW (ref 12.0–15.0)
MCH: 28.2 pg (ref 26.0–34.0)
MCHC: 30.9 g/dL (ref 30.0–36.0)
MCV: 91.3 fL (ref 80.0–100.0)
Platelets: 309 10*3/uL (ref 150–400)
RBC: 4.01 MIL/uL (ref 3.87–5.11)
RDW: 12.8 % (ref 11.5–15.5)
WBC: 5.1 10*3/uL (ref 4.0–10.5)
nRBC: 0 % (ref 0.0–0.2)

## 2023-06-19 LAB — SURGICAL PCR SCREEN
MRSA, PCR: NEGATIVE
Staphylococcus aureus: NEGATIVE

## 2023-06-19 LAB — TYPE AND SCREEN
ABO/RH(D): A POS
Antibody Screen: NEGATIVE

## 2023-06-19 NOTE — Progress Notes (Signed)
PCP - Dr. Myles Lipps Cardiologist - denies  PPM/ICD - denies Device Orders - n/a Rep Notified - n/a  Chest x-ray - denies EKG - denies Stress Test - denies ECHO - denies Cardiac Cath - denies  Sleep Study - denies   Last dose of GLP1 agonist-  n/a GLP1 instructions: n/a  Blood Thinner Instructions: n/a Aspirin Instructions:n/a  ERAS Protcol - cleaurs until 0415 PRE-SURGERY Ensure or G2-  Ensure  COVID TEST- n/a   Anesthesia review: no  Patient denies shortness of breath, fever, cough and chest pain at PAT appointment   All instructions explained to the patient, with a verbal understanding of the material. Patient agrees to go over the instructions while at home for a better understanding. Patient also instructed to self quarantine after being tested for COVID-19. The opportunity to ask questions was provided.

## 2023-06-24 NOTE — Anesthesia Preprocedure Evaluation (Signed)
Anesthesia Evaluation  Patient identified by MRN, date of birth, ID band Patient awake    Reviewed: Allergy & Precautions, NPO status , Patient's Chart, lab work & pertinent test results  Airway Mallampati: II  TM Distance: >3 FB Neck ROM: Full    Dental no notable dental hx. (+) Teeth Intact, Dental Advisory Given   Pulmonary asthma , former smoker   Pulmonary exam normal breath sounds clear to auscultation       Cardiovascular negative cardio ROS Normal cardiovascular exam Rhythm:Regular Rate:Normal     Neuro/Psych  PSYCHIATRIC DISORDERS Anxiety Depression    negative neurological ROS     GI/Hepatic negative GI ROS, Neg liver ROS,,,  Endo/Other  negative endocrine ROS    Renal/GU negative Renal ROS  negative genitourinary   Musculoskeletal  (+) Arthritis ,    Abdominal   Peds  Hematology negative hematology ROS (+)   Anesthesia Other Findings   Reproductive/Obstetrics                             Anesthesia Physical Anesthesia Plan  ASA: 2  Anesthesia Plan: Spinal   Post-op Pain Management: Tylenol PO (pre-op)*   Induction:   PONV Risk Score and Plan: 2 and Treatment may vary due to age or medical condition, Midazolam, Dexamethasone, Propofol infusion and Ondansetron  Airway Management Planned: Natural Airway  Additional Equipment:   Intra-op Plan:   Post-operative Plan:   Informed Consent: I have reviewed the patients History and Physical, chart, labs and discussed the procedure including the risks, benefits and alternatives for the proposed anesthesia with the patient or authorized representative who has indicated his/her understanding and acceptance.     Dental advisory given  Plan Discussed with: CRNA  Anesthesia Plan Comments:        Anesthesia Quick Evaluation

## 2023-06-24 NOTE — H&P (Signed)
TOTAL HIP ADMISSION H&P  Patient is admitted for right total hip arthroplasty.  Subjective:  Chief Complaint: right hip pain  HPI: BARBAR LABELL, 52 y.o. female, has a history of pain and functional disability in the right hip(s) due to arthritis and patient has failed non-surgical conservative treatments for greater than 12 weeks to include NSAID's and/or analgesics, corticosteriod injections, and activity modification.  Onset of symptoms was gradual starting 1 years ago with gradually worsening course since that time.The patient noted no past surgery on the right hip(s).  Patient currently rates pain in the right hip at 10 out of 10 with activity. Patient has night pain, worsening of pain with activity and weight bearing, pain that interfers with activities of daily living, and pain with passive range of motion. Patient has evidence of subchondral sclerosis, periarticular osteophytes, and joint space narrowing by imaging studies. This condition presents safety issues increasing the risk of falls.  There is no current active infection.  Patient Active Problem List   Diagnosis Date Noted   Unilateral primary osteoarthritis, right hip 03/11/2023   Genetic testing 05/26/2018   Family history of breast cancer    Family history of ovarian cancer    Family history of lung cancer    Family history of melanoma    Family history of leukemia    Iron deficiency anemia 02/26/2018   Ductal carcinoma in situ (DCIS) of right breast 02/12/2018   Past Medical History:  Diagnosis Date   Anemia    Anxiety    Arthritis    Asthma    mild   Breast cancer (HCC)    Depression    Family history of breast cancer    Family history of leukemia    Family history of lung cancer    Family history of melanoma    Family history of ovarian cancer    Personal history of radiation therapy    Skin cancer     Past Surgical History:  Procedure Laterality Date   BREAST BIOPSY Right 2019   BREAST LUMPECTOMY  Right 2019   BREAST LUMPECTOMY WITH RADIOACTIVE SEED LOCALIZATION Right 04/09/2018   Procedure: RIGHT BREAST LUMPECTOMY WITH RADIOACTIVE SEED LOCALIZATION;  Surgeon: Glenna Fellows, MD;  Location: [facility];  Service: General;  Laterality: Right;   c sections     COLONOSCOPY     EXCISION MASS HEAD Bilateral 04/24/2023   Procedure: EXCISION OF MOHS DEFECT OF NOSE WITH REPAIR AND RECONSTRUCTION;  Surgeon: Scarlette Ar, MD;  Location: [facility];  Service: ENT;  Laterality: Bilateral;   LUMBAR MICRODISCECTOMY  2001   SKIN FULL THICKNESS GRAFT Left 04/24/2023   Procedure: SKIN GRAFT FROM LEFT EAR;  Surgeon: Scarlette Ar, MD;  Location: [facility];  Service: ENT;  Laterality: Left;   TUBAL LIGATION      No current facility-administered medications for this encounter.   Current Outpatient Medications  Medication Sig Dispense Refill Last Dose   albuterol (PROVENTIL HFA;VENTOLIN HFA) 108 (90 Base) MCG/ACT inhaler Inhale into the lungs every 6 (six) hours as needed for wheezing or shortness of breath.      buPROPion (WELLBUTRIN) 75 MG tablet Take 75 mg by mouth 2 (two) times daily.      celecoxib (CELEBREX) 200 MG capsule Take 200 mg by mouth daily.      Collagen-Vitamin C-Biotin (COLLAGEN 1500/C PO) Take by mouth.      ferrous sulfate 324 MG TBEC Take 324 mg by mouth daily as  needed.      Magnesium 500 MG CAPS Take 1 capsule by mouth daily.      Multiple Vitamin (MULTIVITAMIN) tablet Take 1 tablet by mouth daily.      OVER THE COUNTER MEDICATION Take 1 capsule by mouth daily. Lion's Mane      Turmeric 500 MG CAPS Take by mouth.      mupirocin ointment (BACTROBAN) 2 % Apply 1 Application topically 2 (two) times daily. (Patient not taking: Reported on 06/19/2023)   Not Taking   No Known Allergies  Social History   Tobacco Use   Smoking status: Former    Current packs/day: 0.25    Types: Cigarettes   Smokeless tobacco: Never  Substance Use  Topics   Alcohol use: Yes    Alcohol/week: 14.0 standard drinks of alcohol    Types: 14 Cans of beer per week    Comment: 2 beers /week    Family History  Problem Relation Age of Onset   Hypertension Mother    Ovarian cancer Mother 49   Lung cancer Mother 101   Skin cancer Father        Melanoma   Breast cancer Paternal Grandmother        dx 64s, recurrence in 84s   Lung cancer Maternal Aunt        d. 84s   Leukemia Paternal Aunt    Skin cancer Maternal Grandmother      Review of Systems  Objective:  Physical Exam Vitals reviewed.  Constitutional:      Appearance: Normal appearance. She is normal weight.  HENT:     Head: Normocephalic and atraumatic.  Eyes:     Extraocular Movements: Extraocular movements intact.     Pupils: Pupils are equal, round, and reactive to light.  Cardiovascular:     Rate and Rhythm: Normal rate and regular rhythm.     Pulses: Normal pulses.  Pulmonary:     Effort: Pulmonary effort is normal.     Breath sounds: Normal breath sounds.  Abdominal:     Palpations: Abdomen is soft.  Musculoskeletal:     Cervical back: Normal range of motion and neck supple.     Right hip: Tenderness and bony tenderness present. Decreased range of motion. Decreased strength.  Neurological:     Mental Status: She is alert and oriented to person, place, and time.  Psychiatric:        Behavior: Behavior normal.     Vital signs in last 24 hours:    Labs:   Estimated body mass index is 28.7 kg/m as calculated from the following:   Height as of 06/19/23: 5\' 3"  (1.6 m).   Weight as of 06/19/23: 73.5 kg.   Imaging Review Plain radiographs demonstrate severe degenerative joint disease of the right hip(s). The bone quality appears to be excellent for age and reported activity level.      Assessment/Plan:  End stage arthritis, right hip(s)  The patient history, physical examination, clinical judgement of the provider and imaging studies are consistent  with end stage degenerative joint disease of the right hip(s) and total hip arthroplasty is deemed medically necessary. The treatment options including medical management, injection therapy, arthroscopy and arthroplasty were discussed at length. The risks and benefits of total hip arthroplasty were presented and reviewed. The risks due to aseptic loosening, infection, stiffness, dislocation/subluxation,  thromboembolic complications and other imponderables were discussed.  The patient acknowledged the explanation, agreed to proceed with the plan and consent was signed.  Patient is being admitted for inpatient treatment for surgery, pain control, PT, OT, prophylactic antibiotics, VTE prophylaxis, progressive ambulation and ADL's and discharge planning.The patient is planning to be discharged home with home health services

## 2023-06-25 ENCOUNTER — Observation Stay (HOSPITAL_COMMUNITY)
Admission: RE | Admit: 2023-06-25 | Discharge: 2023-06-26 | Disposition: A | Discharge status: Home / Self Care | Payer: BC Managed Care – PPO | Source: Ambulatory Visit | Attending: Orthopaedic Surgery | Admitting: Orthopaedic Surgery

## 2023-06-25 ENCOUNTER — Encounter (HOSPITAL_COMMUNITY): Payer: Self-pay | Admitting: Orthopaedic Surgery

## 2023-06-25 ENCOUNTER — Other Ambulatory Visit: Payer: Self-pay

## 2023-06-25 ENCOUNTER — Observation Stay (HOSPITAL_COMMUNITY): Payer: BC Managed Care – PPO

## 2023-06-25 ENCOUNTER — Observation Stay (HOSPITAL_COMMUNITY): Payer: BC Managed Care – PPO | Admitting: Anesthesiology

## 2023-06-25 ENCOUNTER — Observation Stay (HOSPITAL_COMMUNITY): Payer: BC Managed Care – PPO | Admitting: Physician Assistant

## 2023-06-25 ENCOUNTER — Encounter (HOSPITAL_COMMUNITY)
Admission: RE | Disposition: A | Discharge status: Home / Self Care | Payer: Self-pay | Source: Ambulatory Visit | Attending: Orthopaedic Surgery

## 2023-06-25 ENCOUNTER — Telehealth: Payer: Self-pay

## 2023-06-25 DIAGNOSIS — Z87891 Personal history of nicotine dependence: Secondary | ICD-10-CM | POA: Diagnosis not present

## 2023-06-25 DIAGNOSIS — Z853 Personal history of malignant neoplasm of breast: Secondary | ICD-10-CM | POA: Diagnosis not present

## 2023-06-25 DIAGNOSIS — J45909 Unspecified asthma, uncomplicated: Secondary | ICD-10-CM | POA: Insufficient documentation

## 2023-06-25 DIAGNOSIS — M1612 Unilateral primary osteoarthritis, left hip: Secondary | ICD-10-CM | POA: Diagnosis present

## 2023-06-25 DIAGNOSIS — Z85828 Personal history of other malignant neoplasm of skin: Secondary | ICD-10-CM | POA: Diagnosis not present

## 2023-06-25 DIAGNOSIS — Z96641 Presence of right artificial hip joint: Secondary | ICD-10-CM

## 2023-06-25 DIAGNOSIS — Z79899 Other long term (current) drug therapy: Secondary | ICD-10-CM | POA: Insufficient documentation

## 2023-06-25 DIAGNOSIS — M1611 Unilateral primary osteoarthritis, right hip: Secondary | ICD-10-CM | POA: Diagnosis present

## 2023-06-25 DIAGNOSIS — M169 Osteoarthritis of hip, unspecified: Secondary | ICD-10-CM | POA: Diagnosis present

## 2023-06-25 HISTORY — PX: TOTAL HIP ARTHROPLASTY: SHX124

## 2023-06-25 SURGERY — ARTHROPLASTY, HIP, TOTAL, ANTERIOR APPROACH
Anesthesia: Spinal | Site: Hip | Laterality: Right

## 2023-06-25 MED ORDER — MIDAZOLAM HCL 2 MG/2ML IJ SOLN
INTRAMUSCULAR | Status: AC
  Filled 2023-06-25: qty 2

## 2023-06-25 MED ORDER — LACTATED RINGERS IV SOLN
INTRAVENOUS | Status: DC
Start: 2023-06-25 — End: 2023-06-25

## 2023-06-25 MED ORDER — OXYCODONE HCL 5 MG PO TABS
5.0000 mg | ORAL_TABLET | ORAL | Status: DC | PRN
  Administered 2023-06-25: 10 mg via ORAL
  Administered 2023-06-25: 5 mg via ORAL
  Administered 2023-06-25: 10 mg via ORAL
  Administered 2023-06-26 (×2): 5 mg via ORAL
  Filled 2023-06-25: qty 2
  Filled 2023-06-25: qty 1
  Filled 2023-06-25 (×3): qty 2

## 2023-06-25 MED ORDER — POVIDONE-IODINE 10 % EX SWAB: 2.0000 | Freq: Once | CUTANEOUS | Status: DC

## 2023-06-25 MED ORDER — PHENYLEPHRINE 80 MCG/ML (10ML) SYRINGE FOR IV PUSH (FOR BLOOD PRESSURE SUPPORT)
PREFILLED_SYRINGE | INTRAVENOUS | Status: AC
  Filled 2023-06-25: qty 10

## 2023-06-25 MED ORDER — LIDOCAINE 2% (20 MG/ML) 5 ML SYRINGE
INTRAMUSCULAR | Status: DC | PRN
  Administered 2023-06-25: 40 mg via INTRAVENOUS

## 2023-06-25 MED ORDER — HYDROMORPHONE HCL 1 MG/ML IJ SOLN: 0.5000 mg | INTRAMUSCULAR | Status: DC | PRN

## 2023-06-25 MED ORDER — TRANEXAMIC ACID-NACL 1000-0.7 MG/100ML-% IV SOLN
1000.0000 mg | INTRAVENOUS | Status: AC
  Administered 2023-06-25: 1000 mg via INTRAVENOUS
  Filled 2023-06-25: qty 100

## 2023-06-25 MED ORDER — FENTANYL CITRATE (PF) 100 MCG/2ML IJ SOLN
INTRAMUSCULAR | Status: AC
  Filled 2023-06-25: qty 2

## 2023-06-25 MED ORDER — MAGNESIUM OXIDE -MG SUPPLEMENT 400 (240 MG) MG PO TABS
400.0000 mg | ORAL_TABLET | Freq: Every day | ORAL | Status: DC
  Administered 2023-06-25 – 2023-06-26 (×2): 400 mg via ORAL
  Filled 2023-06-25 (×2): qty 1

## 2023-06-25 MED ORDER — PROPOFOL 10 MG/ML IV BOLUS
INTRAVENOUS | Status: AC
  Filled 2023-06-25: qty 20

## 2023-06-25 MED ORDER — FENTANYL CITRATE (PF) 100 MCG/2ML IJ SOLN
25.0000 ug | INTRAMUSCULAR | Status: DC | PRN
  Administered 2023-06-25: 25 ug via INTRAVENOUS

## 2023-06-25 MED ORDER — CHLORHEXIDINE GLUCONATE 0.12 % MT SOLN: 15.0000 mL | Freq: Once | OROMUCOSAL | Status: AC

## 2023-06-25 MED ORDER — PANTOPRAZOLE SODIUM 40 MG PO TBEC
40.0000 mg | DELAYED_RELEASE_TABLET | Freq: Every day | ORAL | Status: DC
  Administered 2023-06-25 – 2023-06-26 (×2): 40 mg via ORAL
  Filled 2023-06-25 (×2): qty 1

## 2023-06-25 MED ORDER — SODIUM CHLORIDE 0.9 % IV SOLN: INTRAVENOUS | Status: DC

## 2023-06-25 MED ORDER — ONDANSETRON HCL 4 MG/2ML IJ SOLN: 4.0000 mg | Freq: Four times a day (QID) | INTRAMUSCULAR | Status: DC | PRN

## 2023-06-25 MED ORDER — PROPOFOL 10 MG/ML IV BOLUS
INTRAVENOUS | Status: DC | PRN
  Administered 2023-06-25: 30 mg via INTRAVENOUS
  Administered 2023-06-25: 20 mg via INTRAVENOUS

## 2023-06-25 MED ORDER — DOCUSATE SODIUM 100 MG PO CAPS
100.0000 mg | ORAL_CAPSULE | Freq: Two times a day (BID) | ORAL | Status: DC
  Administered 2023-06-25 – 2023-06-26 (×3): 100 mg via ORAL
  Filled 2023-06-25 (×3): qty 1

## 2023-06-25 MED ORDER — CEFAZOLIN SODIUM-DEXTROSE 2-4 GM/100ML-% IV SOLN
INTRAVENOUS | Status: AC
  Filled 2023-06-25: qty 100

## 2023-06-25 MED ORDER — ACETAMINOPHEN 325 MG PO TABS: 325.0000 mg | ORAL_TABLET | Freq: Four times a day (QID) | ORAL | Status: DC | PRN

## 2023-06-25 MED ORDER — BUPIVACAINE IN DEXTROSE 0.75-8.25 % IT SOLN
INTRATHECAL | Status: DC | PRN
  Administered 2023-06-25: 1.8 mL via INTRATHECAL

## 2023-06-25 MED ORDER — EPHEDRINE 5 MG/ML INJ
INTRAVENOUS | Status: AC
  Filled 2023-06-25: qty 5

## 2023-06-25 MED ORDER — CEFAZOLIN SODIUM-DEXTROSE 2-4 GM/100ML-% IV SOLN
2.0000 g | INTRAVENOUS | Status: AC
  Administered 2023-06-25: 2 g via INTRAVENOUS

## 2023-06-25 MED ORDER — METOCLOPRAMIDE HCL 5 MG/ML IJ SOLN
5.0000 mg | Freq: Three times a day (TID) | INTRAMUSCULAR | Status: DC | PRN
Start: 2023-06-25 — End: 2023-06-26

## 2023-06-25 MED ORDER — LIDOCAINE 2% (20 MG/ML) 5 ML SYRINGE
INTRAMUSCULAR | Status: AC
  Filled 2023-06-25: qty 5

## 2023-06-25 MED ORDER — CHLORHEXIDINE GLUCONATE 0.12 % MT SOLN
OROMUCOSAL | Status: AC
  Administered 2023-06-25: 15 mL via OROMUCOSAL
  Filled 2023-06-25: qty 15

## 2023-06-25 MED ORDER — METOCLOPRAMIDE HCL 5 MG PO TABS: 5.0000 mg | ORAL_TABLET | Freq: Three times a day (TID) | ORAL | Status: DC | PRN

## 2023-06-25 MED ORDER — PHENYLEPHRINE HCL-NACL 20-0.9 MG/250ML-% IV SOLN
INTRAVENOUS | Status: DC | PRN
  Administered 2023-06-25: 50 ug/min via INTRAVENOUS

## 2023-06-25 MED ORDER — CEFAZOLIN SODIUM-DEXTROSE 2-4 GM/100ML-% IV SOLN
2.0000 g | Freq: Four times a day (QID) | INTRAVENOUS | Status: AC
  Administered 2023-06-25 (×2): 2 g via INTRAVENOUS
  Filled 2023-06-25 (×2): qty 100

## 2023-06-25 MED ORDER — MENTHOL 3 MG MT LOZG: 1.0000 | LOZENGE | OROMUCOSAL | Status: DC | PRN

## 2023-06-25 MED ORDER — SODIUM CHLORIDE 0.9 % IV SOLN: INTRAVENOUS | Status: DC | PRN

## 2023-06-25 MED ORDER — SENNA 8.6 MG PO TABS
1.0000 | ORAL_TABLET | Freq: Two times a day (BID) | ORAL | Status: DC | PRN
  Administered 2023-06-25: 8.6 mg via ORAL
  Filled 2023-06-25: qty 1

## 2023-06-25 MED ORDER — MIDAZOLAM HCL 2 MG/2ML IJ SOLN
INTRAMUSCULAR | Status: DC | PRN
  Administered 2023-06-25: 2 mg via INTRAVENOUS

## 2023-06-25 MED ORDER — ONDANSETRON HCL 4 MG/2ML IJ SOLN
INTRAMUSCULAR | Status: AC
  Filled 2023-06-25: qty 2

## 2023-06-25 MED ORDER — ASPIRIN 81 MG PO CHEW
81.0000 mg | CHEWABLE_TABLET | Freq: Two times a day (BID) | ORAL | Status: DC
  Administered 2023-06-25 – 2023-06-26 (×2): 81 mg via ORAL
  Filled 2023-06-25 (×2): qty 1

## 2023-06-25 MED ORDER — DEXAMETHASONE SODIUM PHOSPHATE 10 MG/ML IJ SOLN
INTRAMUSCULAR | Status: DC | PRN
  Administered 2023-06-25: 10 mg via INTRAVENOUS

## 2023-06-25 MED ORDER — BUPROPION HCL 75 MG PO TABS
75.0000 mg | ORAL_TABLET | Freq: Two times a day (BID) | ORAL | Status: DC
  Administered 2023-06-25 – 2023-06-26 (×2): 75 mg via ORAL
  Filled 2023-06-25 (×2): qty 1

## 2023-06-25 MED ORDER — ACETAMINOPHEN 500 MG PO TABS
ORAL_TABLET | ORAL | Status: AC
  Administered 2023-06-25: 1000 mg via ORAL
  Filled 2023-06-25: qty 2

## 2023-06-25 MED ORDER — FENTANYL CITRATE (PF) 250 MCG/5ML IJ SOLN
INTRAMUSCULAR | Status: AC
  Filled 2023-06-25: qty 5

## 2023-06-25 MED ORDER — OXYCODONE HCL 5 MG PO TABS: 10.0000 mg | ORAL_TABLET | ORAL | Status: DC | PRN

## 2023-06-25 MED ORDER — ROCURONIUM BROMIDE 10 MG/ML (PF) SYRINGE
PREFILLED_SYRINGE | INTRAVENOUS | Status: AC
  Filled 2023-06-25: qty 10

## 2023-06-25 MED ORDER — ALUM & MAG HYDROXIDE-SIMETH 200-200-20 MG/5ML PO SUSP: 30.0000 mL | ORAL | Status: DC | PRN

## 2023-06-25 MED ORDER — PHENOL 1.4 % MT LIQD: 1.0000 | OROMUCOSAL | Status: DC | PRN

## 2023-06-25 MED ORDER — METHOCARBAMOL 500 MG PO TABS
500.0000 mg | ORAL_TABLET | Freq: Four times a day (QID) | ORAL | Status: DC | PRN
  Administered 2023-06-25 – 2023-06-26 (×3): 500 mg via ORAL
  Filled 2023-06-25 (×3): qty 1

## 2023-06-25 MED ORDER — FENTANYL CITRATE (PF) 250 MCG/5ML IJ SOLN
INTRAMUSCULAR | Status: DC | PRN
  Administered 2023-06-25 (×2): 50 ug via INTRAVENOUS

## 2023-06-25 MED ORDER — ORAL CARE MOUTH RINSE: 15.0000 mL | Freq: Once | OROMUCOSAL | Status: AC

## 2023-06-25 MED ORDER — METHOCARBAMOL 1000 MG/10ML IJ SOLN: 500.0000 mg | Freq: Four times a day (QID) | INTRAMUSCULAR | Status: DC | PRN

## 2023-06-25 MED ORDER — PROPOFOL 500 MG/50ML IV EMUL
INTRAVENOUS | Status: DC | PRN
  Administered 2023-06-25: 100 ug/kg/min via INTRAVENOUS

## 2023-06-25 MED ORDER — DEXAMETHASONE SODIUM PHOSPHATE 10 MG/ML IJ SOLN
INTRAMUSCULAR | Status: AC
Start: 2023-06-25 — End: ?
  Filled 2023-06-25: qty 1

## 2023-06-25 MED ORDER — DIPHENHYDRAMINE HCL 12.5 MG/5ML PO ELIX: 12.5000 mg | ORAL_SOLUTION | ORAL | Status: DC | PRN

## 2023-06-25 MED ORDER — 0.9 % SODIUM CHLORIDE (POUR BTL) OPTIME
TOPICAL | Status: DC | PRN
  Administered 2023-06-25: 1000 mL

## 2023-06-25 MED ORDER — ONDANSETRON HCL 4 MG PO TABS: 4.0000 mg | ORAL_TABLET | Freq: Four times a day (QID) | ORAL | Status: DC | PRN

## 2023-06-25 MED ORDER — ACETAMINOPHEN 500 MG PO TABS: 1000.0000 mg | ORAL_TABLET | Freq: Once | ORAL | Status: AC

## 2023-06-25 SURGICAL SUPPLY — 42 items
BALL HIP CERAMIC (Hips) IMPLANT
BENZOIN TINCTURE PRP APPL 2/3 (GAUZE/BANDAGES/DRESSINGS) ×2 IMPLANT
BLADE SAW SGTL 18X1.27X75 (BLADE) ×2 IMPLANT
COVER SURGICAL LIGHT HANDLE (MISCELLANEOUS) ×2 IMPLANT
CUP SECTOR GRIPTON 50MM (Cup) IMPLANT
DRAPE C-ARM 42X72 X-RAY (DRAPES) ×2 IMPLANT
DRAPE STERI IOBAN 125X83 (DRAPES) ×2 IMPLANT
DRAPE U-SHAPE 47X51 STRL (DRAPES) ×6 IMPLANT
DRSG AQUACEL AG ADV 3.5X10 (GAUZE/BANDAGES/DRESSINGS) ×2 IMPLANT
DURAPREP 26ML APPLICATOR (WOUND CARE) ×2 IMPLANT
ELECT BLADE 4.0 EZ CLEAN MEGAD (MISCELLANEOUS) ×1 IMPLANT
ELECT REM PT RETURN 9FT ADLT (ELECTROSURGICAL) ×1 IMPLANT
ELECTRODE BLDE 4.0 EZ CLN MEGD (MISCELLANEOUS) ×2 IMPLANT
ELECTRODE REM PT RTRN 9FT ADLT (ELECTROSURGICAL) ×2 IMPLANT
FACESHIELD WRAPAROUND (MASK) ×2 IMPLANT
FACESHIELD WRAPAROUND OR TEAM (MASK) ×4 IMPLANT
GLOVE BIOGEL PI IND STRL 8 (GLOVE) ×4 IMPLANT
GLOVE ECLIPSE 8.0 STRL XLNG CF (GLOVE) ×2 IMPLANT
GLOVE ORTHO TXT STRL SZ7.5 (GLOVE) ×4 IMPLANT
GOWN STRL REUS W/ TWL LRG LVL3 (GOWN DISPOSABLE) ×4 IMPLANT
GOWN STRL REUS W/ TWL XL LVL3 (GOWN DISPOSABLE) ×4 IMPLANT
HIP BALL CERAMIC (Hips) ×1 IMPLANT
KIT BASIN OR (CUSTOM PROCEDURE TRAY) ×2 IMPLANT
KIT TURNOVER KIT B (KITS) ×2 IMPLANT
LINER ACETABULAR 32X50 (Liner) IMPLANT
MANIFOLD NEPTUNE II (INSTRUMENTS) ×2 IMPLANT
NS IRRIG 1000ML POUR BTL (IV SOLUTION) ×2 IMPLANT
PACK TOTAL JOINT (CUSTOM PROCEDURE TRAY) ×2 IMPLANT
PAD ARMBOARD 7.5X6 YLW CONV (MISCELLANEOUS) ×2 IMPLANT
SET HNDPC FAN SPRY TIP SCT (DISPOSABLE) ×2 IMPLANT
STAPLER VISISTAT 35W (STAPLE) IMPLANT
STEM FEM ACTIS HIGH SZ2 (Stem) IMPLANT
STRIP CLOSURE SKIN 1/2X4 (GAUZE/BANDAGES/DRESSINGS) ×4 IMPLANT
SUT ETHIBOND NAB CT1 #1 30IN (SUTURE) ×2 IMPLANT
SUT MNCRL AB 4-0 PS2 18 (SUTURE) IMPLANT
SUT VIC AB 0 CT1 27XBRD ANBCTR (SUTURE) ×2 IMPLANT
SUT VIC AB 1 CT1 27XBRD ANBCTR (SUTURE) ×2 IMPLANT
SUT VIC AB 2-0 CT1 TAPERPNT 27 (SUTURE) ×2 IMPLANT
TOWEL GREEN STERILE (TOWEL DISPOSABLE) ×2 IMPLANT
TOWEL GREEN STERILE FF (TOWEL DISPOSABLE) ×2 IMPLANT
TRAY FOLEY W/BAG SLVR 14FR (SET/KITS/TRAYS/PACK) IMPLANT
WATER STERILE IRR 1000ML POUR (IV SOLUTION) ×4 IMPLANT

## 2023-06-25 NOTE — Interval H&P Note (Signed)
History and Physical Interval Note: The patient understands that she is here today for right total hip replacement to treat her severe right hip arthritis.  There has been no acute or interval change in her medical status.  The risks and benefits of surgery been discussed in detail and informed consent has been obtained.  The right operative hip has been marked.  06/25/2023 7:14 AM  Ann Patton  has presented today for surgery, with the diagnosis of osteoarthritis right hip.  The various methods of treatment have been discussed with the patient and family. After consideration of risks, benefits and other options for treatment, the patient has consented to  Procedure(s): RIGHT TOTAL HIP ARTHROPLASTY ANTERIOR APPROACH (Right) as a surgical intervention.  The patient's history has been reviewed, patient examined, no change in status, stable for surgery.  I have reviewed the patient's chart and labs.  Questions were answered to the patient's satisfaction.     Kathryne Hitch

## 2023-06-25 NOTE — Evaluation (Signed)
Physical Therapy Evaluation Patient Details Name: Ann Patton MRN: [id number] DOB: 1971-07-12 Today's Date: 06/25/2023  History of Present Illness  52 y.o. female presents to [facility] hospital on 06/25/2023 for elective R THA. PMH includes R breast CA.  Clinical Impression  Pt presents to PT with deficits in strength, power, endurance, gait, balance. Pt is limited by hypotension after standing, reporting dizziness and with diaphoresis. Pt BP 79/53 after standing this session. In standing pt was able to weight shift well and march in place for multiple steps, with ambulation away from bedside deferred due to symptoms. PT anticipates the pt will progress well. PT will return tomorrow for further gait and stair training.       If plan is discharge home, recommend the following: A little help with walking and/or transfers;A little help with bathing/dressing/bathroom;Assistance with cooking/housework;Assist for transportation;Help with stairs or ramp for entrance   Can travel by private vehicle        Equipment Recommendations Rolling walker (2 wheels);BSC/3in1  Recommendations for Other Services       Functional Status Assessment Patient has had a recent decline in their functional status and demonstrates the ability to make significant improvements in function in a reasonable and predictable amount of time.     Precautions / Restrictions Precautions Precautions: Fall Precaution Comments: direct anterior THA Restrictions Weight Bearing Restrictions: Yes RLE Weight Bearing: Weight bearing as tolerated      Mobility  Bed Mobility Overal bed mobility: Needs Assistance Bed Mobility: Supine to Sit, Sit to Supine     Supine to sit: Supervision Sit to supine: Supervision        Transfers Overall transfer level: Needs assistance Equipment used: Rolling walker (2 wheels) Transfers: Sit to/from Stand Sit to Stand: Contact guard assist                Ambulation/Gait              Pre-gait activities: pt is able to march in place for 8 steps with CGA, ambulation deferred away from bedside due to dizziness and diaphoresis    Stairs            Wheelchair Mobility     Tilt Bed    Modified Rankin (Stroke Patients Only)       Balance Overall balance assessment: Needs assistance Sitting-balance support: No upper extremity supported, Feet supported Sitting balance-Leahy Scale: Good     Standing balance support: Single extremity supported, Reliant on assistive device for balance Standing balance-Leahy Scale: Poor                               Pertinent Vitals/Pain Pain Assessment Pain Assessment: 0-10 Pain Score: 3  Pain Location: R hip incision Pain Descriptors / Indicators: Sore Pain Intervention(s): Monitored during session    Home Living Family/patient expects to be discharged to:: Private residence Living Arrangements: Spouse/significant other;Children Available Help at Discharge: Family Type of Home: House Home Access: Level entry     Alternate Level Stairs-Number of Steps: 12 Home Layout: Two level Home Equipment: None      Prior Function Prior Level of Function : Independent/Modified Independent                     Extremity/Trunk Assessment   Upper Extremity Assessment Upper Extremity Assessment: Overall WFL for tasks assessed    Lower Extremity Assessment Lower Extremity Assessment: RLE deficits/detail RLE Deficits / Details: pt  with generalized post-op weakness    Cervical / Trunk Assessment Cervical / Trunk Assessment: Normal  Communication   Communication Communication: No apparent difficulties Cueing Techniques: Verbal cues  Cognition Arousal: Alert Behavior During Therapy: WFL for tasks assessed/performed Overall Cognitive Status: Within Functional Limits for tasks assessed                                          General Comments General comments (skin  integrity, edema, etc.): pt with hypotension after standing, BP 79/53 in sitting. RN made aware    Exercises     Assessment/Plan    PT Assessment Patient needs continued PT services  PT Problem List Decreased strength;Decreased activity tolerance;Decreased balance;Decreased mobility;Decreased safety awareness;Decreased knowledge of use of DME;Decreased knowledge of precautions;Pain       PT Treatment Interventions DME instruction;Gait training;Stair training;Functional mobility training;Therapeutic activities;Therapeutic exercise;Balance training;Neuromuscular re-education;Patient/family education    PT Goals (Current goals can be found in the Care Plan section)  Acute Rehab PT Goals Patient Stated Goal: to return to independence PT Goal Formulation: With patient Time For Goal Achievement: 06/29/23 Potential to Achieve Goals: Good    Frequency Min 1X/week     Co-evaluation               AM-PAC PT "6 Clicks" Mobility  Outcome Measure Help needed turning from your back to your side while in a flat bed without using bedrails?: A Little Help needed moving from lying on your back to sitting on the side of a flat bed without using bedrails?: A Little Help needed moving to and from a bed to a chair (including a wheelchair)?: A Little Help needed standing up from a chair using your arms (e.g., wheelchair or bedside chair)?: A Little Help needed to walk in hospital room?: Total Help needed climbing 3-5 steps with a railing? : Total 6 Click Score: 14    End of Session Equipment Utilized During Treatment: Gait belt Activity Tolerance: Treatment limited secondary to medical complications (Comment) (hypotension) Patient left: in bed;with call bell/phone within reach Nurse Communication: Mobility status PT Visit Diagnosis: Other abnormalities of gait and mobility (R26.89);Muscle weakness (generalized) (M62.81);Pain Pain - Right/Left: Right Pain - part of body: Hip    Time:  1308-6578 PT Time Calculation (min) (ACUTE ONLY): 17 min   Charges:   PT Evaluation $PT Eval Low Complexity: 1 Low   PT General Charges $$ ACUTE PT VISIT: 1 Visit         Arlyss Gandy, PT, DPT Acute Rehabilitation Office [phone]   Arlyss Gandy 06/25/2023, 4:02 PM

## 2023-06-25 NOTE — Transfer of Care (Signed)
Immediate Anesthesia Transfer of Care Note  Patient: Ann Patton  Procedure(s) Performed: RIGHT TOTAL HIP ARTHROPLASTY ANTERIOR APPROACH (Right: Hip)  Patient Location: PACU  Anesthesia Type:Spinal  Level of Consciousness: awake, alert , and oriented  Airway & Oxygen Therapy: Patient Spontanous Breathing  Post-op Assessment: Report given to RN and Post -op Vital signs reviewed and stable  Post vital signs: Reviewed and stable  Last Vitals:  Vitals Value Taken Time  BP 90/51 06/25/23 0855  Temp    Pulse 67 06/25/23 0859  Resp 16 06/25/23 0859  SpO2 98 % 06/25/23 0859  Vitals shown include unfiled device data.  Last Pain:  Vitals:   06/25/23 0634  TempSrc:   PainSc: 6       Patients Stated Pain Goal: 3 (06/25/23 1478)  Complications: No notable events documented.

## 2023-06-25 NOTE — Op Note (Signed)
Operative Note  Date of operation: 06/25/2023 Preoperative diagnosis: Right hip primary osteoarthritis Postoperative diagnosis: Same  Procedure: Right direct anterior total hip arthroplasty  Implants: Implant Name Type Inv. Item Serial No. Manufacturer Lot No. LRB No. Used Action  CUP SECTOR GRIPTON - [id number] Cup CUP SECTOR GRIPTON  [employer] [id number] Right 1 Implanted  LINER ACETABULAR 32X50 - [id number] Liner LINER ACETABULAR 32X50  DEPUY ORTHOPAEDICS [id number] Right 1 Implanted   Surgeon: Vanita Panda. 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  This been verified on clinical exam and x-ray findings.  Her x-rays show bone-on-bone wear of the right hip.  At this point she is been dealing with this for a long period of time.  She has failed conservative treatment and her right hip pain has become daily.  It is definitely affecting her mobility, her quality life and her actives daily living.  She wishes to proceed with a hip replacement on the right side and we agree with this as well.  We did discuss the risks of acute blood loss anemia, nerve and vessel injury, fracture, infection, DVT, dislocation, implant failure, leg length differences and wound healing issues.  She understands that our goals are hopefully decreased pain, improved mobility and improved quality of life.  Procedure description: After informed consent was obtained and the appropriate right hip was marked, the patient was brought to the operating room and set up on the stretcher where spinal anesthesia was obtained.  She was then laid in supine position on the stretcher and a Foley catheter was placed.  I was able to assess her leg lengths and she is definitely shorter on her right leg than her left.  We will  compensate for that intraoperatively.  Traction boots were placed on both her feet and she was next placed supine on the Hana fracture table with a perineal post and placed in both legs and inline skeletal traction devices but no traction applied.  Her right operative hip and pelvis were assessed radiographically.  The right hip was prepped and draped with DuraPrep and sterile drapes.  A timeout was called and she was notified of the correct patient the correct right hip.  An incision was then made just inferior and posterior to the ASIS and carried slightly obliquely down the leg.  Dissection was carried down to the tensor fascia lata muscle and the tensor fascia was then divided longitudinally to proceed with a direct anterior approach to the hip.  Circumflex vessels were identified and cauterized.  The hip capsule was notified and opened up in L-type format.  Cobra retractors were then placed around the medial and lateral femoral neck and a femoral neck cut was made with an oscillating saw just proximal to the lesser trochanter.  This cut was completed with an osteotome and a corkscrew guide was placed in the femoral head and femoral head was removed in its entirety and found to have a wide area devoid of cartilage.  A bent Hohmann was then placed over the medial sterile rim and remnants of the acetabular labrum and other debris removed.  Reaming was then initiated from a size 43 reamer and stepwise increments from size 43 reamer going up to a size 49 reamer with all reamers placed under direct visualization and the last reamer also placed under direct fluoroscopy in  order to obtain the depth of reaming, the inclination and anteversion.  The real DePuy sector GRIPTION acetabular component size 50 was then placed without difficulty followed by 32+0 polyethylene liner.  Attention was then turned to the femur.  With the right leg externally rotated to 120 degrees, extended and adducted, a Mueller retractor was  placed medially and a Hohmann retractor behind the greater trochanter.  The lateral joint capsule was released and a box cutting osteotome was used in the femoral canal.  Broaching was then initiated using the Actis broaching system from a size 0 going to a size 2.  With a size 2 in place based on her anatomy we trialed a high offset femoral neck and a 32+1 trial hip ball.  The right leg was brought over and up and with traction and internal rotation reduced in the pelvis.  Patient radiograph and clinical assessment we needed more leg length.  We dislocated the hip remove the trial components.  We placed the real Actis femoral component with high offset size 2 and 1 with the real 32+5 ceramic hip ball.  Again the right leg was brought over and up and with traction and internal rotation reduced in the pelvis.  We are pleased with leg length, offset, range of motion and stability assessed radiographically and clinically.  The soft tissue was then irrigated with normal saline solution.  Joint capsule was closed with interrupted #1 Ethibond suture followed by #1 Vicryl to close the tensor fascia.  0 Vicryl was used for the deep tissue and 2-0 Vicryl was used to close subcutaneous tissue.  Skin was closed with staples.  Aquacel dressing was applied.  Patient was taken off the Hana table and taken to recovery room.  Rexene Edison, PA-C did assist for the entire case from beginning to end and his assistance was crucial and medically necessary for soft tissue management and retraction, helping guide implant placement and a layered closure of the wound.

## 2023-06-25 NOTE — Anesthesia Postprocedure Evaluation (Signed)
Anesthesia Post Note  Patient: Ann Patton  Procedure(s) Performed: RIGHT TOTAL HIP ARTHROPLASTY ANTERIOR APPROACH (Right: Hip)     Patient location during evaluation: PACU Anesthesia Type: Spinal Level of consciousness: oriented and awake and alert Pain management: pain level controlled Vital Signs Assessment: post-procedure vital signs reviewed and stable Respiratory status: spontaneous breathing, respiratory function stable and patient connected to nasal cannula oxygen Cardiovascular status: blood pressure returned to baseline and stable Postop Assessment: no headache, no backache and no apparent nausea or vomiting Anesthetic complications: no  No notable events documented.  Last Vitals:  Vitals:   06/25/23 1245 06/25/23 1307  BP: 120/71 119/79  Pulse: 68 78  Resp: 11 20  Temp: 36.5 C   SpO2: 98% 100%    Last Pain:  Vitals:   06/25/23 1245  TempSrc:   PainSc: Asleep                 Demitra Danley L Eldor Conaway

## 2023-06-25 NOTE — Progress Notes (Signed)
Home therapy arranged with [facility]. Information on the AVS.  TOC following.

## 2023-06-25 NOTE — Discharge Instructions (Signed)

## 2023-06-25 NOTE — Telephone Encounter (Signed)
Referral sent to [facility] for HHPT

## 2023-06-25 NOTE — Anesthesia Procedure Notes (Signed)
Spinal  Patient location during procedure: OR Start time: 06/25/2023 7:28 AM End time: 06/25/2023 7:31 AM Reason for block: surgical anesthesia Staffing Performed: anesthesiologist  Anesthesiologist: Elmer Picker, MD Performed by: Elmer Picker, MD Authorized by: Elmer Picker, MD   Preanesthetic Checklist Completed: patient identified, IV checked, risks and benefits discussed, surgical consent, monitors and equipment checked, pre-op evaluation and timeout performed Spinal Block Patient position: sitting Prep: DuraPrep and site prepped and draped Patient monitoring: cardiac monitor, continuous pulse ox and blood pressure Approach: midline Location: L3-4 Injection technique: single-shot Needle Needle type: Pencan  Needle gauge: 24 G Needle length: 9 cm Assessment Sensory level: T6 Events: CSF return Additional Notes Functioning IV was confirmed and monitors were applied. Sterile prep and drape, including hand hygiene and sterile gloves were used. The patient was positioned and the spine was prepped. The skin was anesthetized with lidocaine.  Free flow of clear CSF was obtained prior to injecting local anesthetic into the CSF.  The spinal needle aspirated freely following injection.  The needle was carefully withdrawn.  The patient tolerated the procedure well.

## 2023-06-26 ENCOUNTER — Encounter (HOSPITAL_COMMUNITY): Payer: Self-pay | Admitting: Orthopaedic Surgery

## 2023-06-26 DIAGNOSIS — M1611 Unilateral primary osteoarthritis, right hip: Secondary | ICD-10-CM | POA: Diagnosis not present

## 2023-06-26 LAB — BASIC METABOLIC PANEL
Anion gap: 8 (ref 5–15)
BUN: 11 mg/dL (ref 6–20)
CO2: 23 mmol/L (ref 22–32)
Calcium: 8.5 mg/dL — ABNORMAL LOW (ref 8.9–10.3)
Chloride: 101 mmol/L (ref 98–111)
Creatinine, Ser: 1.02 mg/dL — ABNORMAL HIGH (ref 0.44–1.00)
GFR, Estimated: >60 mL/min (ref >60)
Glucose, Bld: 132 mg/dL — ABNORMAL HIGH (ref 70–99)
Potassium: 3.7 mmol/L (ref 3.5–5.1)
Sodium: 132 mmol/L — ABNORMAL LOW (ref 135–145)

## 2023-06-26 LAB — CBC
HCT: 29 % — ABNORMAL LOW (ref 36.0–46.0)
Hemoglobin: 9.3 g/dL — ABNORMAL LOW (ref 12.0–15.0)
MCH: 28.8 pg (ref 26.0–34.0)
MCHC: 32.1 g/dL (ref 30.0–36.0)
MCV: 89.8 fL (ref 80.0–100.0)
Platelets: 264 10*3/uL (ref 150–400)
RBC: 3.23 MIL/uL — ABNORMAL LOW (ref 3.87–5.11)
RDW: 12.7 % (ref 11.5–15.5)
WBC: 8.2 10*3/uL (ref 4.0–10.5)
nRBC: 0 % (ref 0.0–0.2)

## 2023-06-26 MED ORDER — ASPIRIN 81 MG PO CHEW: 81.0000 mg | CHEWABLE_TABLET | Freq: Two times a day (BID) | ORAL | 0 refills | Status: DC

## 2023-06-26 MED ORDER — METHOCARBAMOL 500 MG PO TABS: 500.0000 mg | ORAL_TABLET | Freq: Four times a day (QID) | ORAL | 1 refills | Status: DC | PRN

## 2023-06-26 MED ORDER — OXYCODONE HCL 5 MG PO TABS: 5.0000 mg | ORAL_TABLET | Freq: Four times a day (QID) | ORAL | 0 refills | Status: DC | PRN

## 2023-06-26 NOTE — Discharge Summary (Signed)
Patient ID: Ann Patton MRN: [id number] DOB/AGE: [birth year] 52 y.o.  Admit date: 06/25/2023 Discharge date: 06/26/2023  Admission Diagnoses:  Principal Problem:   Unilateral primary osteoarthritis, right hip Active Problems:   OA (osteoarthritis) of hip   Status post total replacement of right hip   Discharge Diagnoses:  Same  Past Medical History:  Diagnosis Date   Anemia    Anxiety    Arthritis    Asthma    mild   Breast cancer (HCC)    Depression    Family history of breast cancer    Family history of leukemia    Family history of lung cancer    Family history of melanoma    Family history of ovarian cancer    Personal history of radiation therapy    Skin cancer     Surgeries: Procedure(s): RIGHT TOTAL HIP ARTHROPLASTY ANTERIOR APPROACH on 06/25/2023   Consultants:   Discharged Condition: Improved  Hospital Course: Ann Patton is an 52 y.o. female who was admitted 06/25/2023 for operative treatment ofUnilateral primary osteoarthritis, right hip. Patient has severe unremitting pain that affects sleep, daily activities, and work/hobbies. After pre-op clearance the patient was taken to the operating room on 06/25/2023 and underwent  Procedure(s): RIGHT TOTAL HIP ARTHROPLASTY ANTERIOR APPROACH.    Patient was given perioperative antibiotics:  Anti-infectives (From admission, onward)    Start     Dose/Rate Route Frequency Ordered Stop   06/25/23 1500  ceFAZolin (ANCEF) IVPB 2g/100 mL premix        2 g 200 mL/hr over 30 Minutes Intravenous Every 6 hours 06/25/23 1303 06/26/23 0847   06/25/23 0600  ceFAZolin (ANCEF) IVPB 2g/100 mL premix        2 g 200 mL/hr over 30 Minutes Intravenous On call to O.R. 06/25/23 1308 06/25/23 0734   06/25/23 0558  ceFAZolin (ANCEF) 2-4 GM/100ML-% IVPB       Note to Pharmacy: Remus Blake: cabinet override      06/25/23 0558 06/25/23 0736        Patient was given sequential compression devices, early  ambulation, and chemoprophylaxis to prevent DVT.  Patient benefited maximally from hospital stay and there were no complications.    Recent vital signs: Patient Vitals for the past 24 hrs:  BP Temp Temp src Pulse Resp SpO2  06/26/23 0751 129/75 97.6 F (36.4 C) Oral 90 20 100 %  06/26/23 0537 113/71 99.1 F (37.3 C) Oral 87 18 100 %  06/26/23 0423 (!) 95/51 -- -- 91 18 98 %  06/26/23 0403 93/65 99.4 F (37.4 C) Oral 85 18 99 %  06/25/23 2300 (!) 104/56 99.8 F (37.7 C) Oral 83 18 100 %  06/25/23 1935 (!) 99/57 99.3 F (37.4 C) Oral 85 18 100 %  06/25/23 1710 104/69 97.7 F (36.5 C) Oral 70 20 100 %  06/25/23 1307 119/79 -- -- 78 20 100 %  06/25/23 1304 -- -- -- -- -- 100 %  06/25/23 1245 120/71 97.7 F (36.5 C) -- 68 11 98 %  06/25/23 1230 115/70 -- -- (!) 59 11 98 %  06/25/23 1215 109/63 -- -- 73 16 99 %  06/25/23 1200 111/67 -- -- 66 11 98 %  06/25/23 1145 119/67 -- -- 62 12 98 %  06/25/23 1130 100/68 -- -- (!) 57 10 98 %     Recent laboratory studies:  Recent Labs    06/26/23 0655  WBC 8.2  HGB 9.3*  HCT 29.0*  PLT 264  NA 132*  K 3.7  CL 101  CO2 23  BUN 11  CREATININE 1.02*  GLUCOSE 132*  CALCIUM 8.5*     Discharge Medications:   Allergies as of 06/26/2023   No Known Allergies      Medication List     TAKE these medications    albuterol 108 (90 Base) MCG/ACT inhaler Commonly known as: VENTOLIN HFA Inhale into the lungs every 6 (six) hours as needed for wheezing or shortness of breath.   aspirin 81 MG chewable tablet Chew 1 tablet (81 mg total) by mouth 2 (two) times daily.   buPROPion 75 MG tablet Commonly known as: WELLBUTRIN Take 75 mg by mouth 2 (two) times daily.   celecoxib 200 MG capsule Commonly known as: CELEBREX Take 200 mg by mouth daily.   COLLAGEN 1500/C PO Take by mouth.   ferrous sulfate 324 MG Tbec Take 324 mg by mouth daily as needed.   Magnesium 500 MG Caps Take 1 capsule by mouth daily.   methocarbamol 500 MG  tablet Commonly known as: ROBAXIN Take 1 tablet (500 mg total) by mouth every 6 (six) hours as needed for muscle spasms.   multivitamin tablet Take 1 tablet by mouth daily.   mupirocin ointment 2 % Commonly known as: BACTROBAN Apply 1 Application topically 2 (two) times daily.   OVER THE COUNTER MEDICATION Take 1 capsule by mouth daily. [place]   oxyCODONE 5 MG immediate release tablet Commonly known as: Oxy IR/ROXICODONE Take 1-2 tablets (5-10 mg total) by mouth every 6 (six) hours as needed for moderate pain (pain score 4-6) (pain score 4-6).   Turmeric 500 MG Caps Take by mouth.               Durable Medical Equipment  (From admission, onward)           Start     Ordered   06/25/23 1304  DME 3 n 1  Once        06/25/23 1303   06/25/23 1304  DME Walker rolling  Once       Question Answer Comment  Walker: With 5 Inch Wheels   Patient needs a walker to treat with the following condition Status post total replacement of right hip      06/25/23 1303            Diagnostic Studies: DG Pelvis Portable  Result Date: 06/25/2023 CLINICAL DATA:  Status post total right hip arthroplasty. EXAM: PORTABLE PELVIS 1-2 VIEWS COMPARISON:  Pelvis and right hip radiographs 03/11/2023 FINDINGS: Interval total right hip arthroplasty. No perihardware lucency is seen to indicate hardware failure or loosening. Expected postoperative subcutaneous air about the right hip. Lateral right hip surgical skin staples. Mild superolateral left acetabular and mild superior pubic symphysis degenerative spurring. No acute fracture or dislocation. IMPRESSION: Interval total right hip arthroplasty without evidence of hardware failure. Electronically Signed   By: Neita Garnet M.D.   On: 06/25/2023 12:46   DG HIP UNILAT WITH PELVIS 1V RIGHT  Result Date: 06/25/2023 CLINICAL DATA:  Total right hip arthroplasty. Intraoperative fluoroscopy. EXAM: DG HIP (WITH OR WITHOUT PELVIS) 1V RIGHT  COMPARISON:  Pelvis and right hip radiographs 03/11/2023 FINDINGS: Images were performed intraoperatively without the presence of a radiologist. Interval total right hip arthroplasty. No hardware complication is seen. Total fluoroscopy images: 3 Total fluoroscopy time: 15 seconds Total dose: Radiation Exposure Index (as provided by the fluoroscopic device): 1.33 mGy air Kerma Please see  intraoperative findings for further detail. IMPRESSION: Intraoperative fluoroscopy for total right hip arthroplasty. Electronically Signed   By: Neita Garnet M.D.   On: 06/25/2023 12:38   DG C-Arm 1-60 Min-No Report  Result Date: 06/25/2023 Fluoroscopy was utilized by the requesting physician.  No radiographic interpretation.    Disposition: Discharge disposition: 01-Home or Self Care          Follow-up Information     [facility] Follow up.   Specialty: Home Health Services Why: The home health agency will contact you for the first home visit. Contact information: [address] [place] [address] [phone]         Kathryne Hitch, MD Follow up in 2 week(s).   Specialty: Orthopedic Surgery Contact information: [address] [place] [address] [phone]                  Signed: Kathryne Hitch 06/26/2023, 11:22 AM

## 2023-06-26 NOTE — Progress Notes (Signed)
Patient alert and oriented, ambulate, void. Surgical site clean and dry no sign of infection. D/c instructions explain and given all questions answered.

## 2023-06-26 NOTE — Progress Notes (Signed)
Patient d/c home with RW and bedside commode per order.

## 2023-06-26 NOTE — Progress Notes (Signed)
Physical Therapy Treatment Patient Details Name: Ann Patton MRN: [id number] DOB: 1971-01-26 Today's Date: 06/26/2023   History of Present Illness 52 y.o. female presents to [facility] hospital on 06/25/2023 for elective R THA. PMH includes R breast CA.    PT Comments  Continuing work on functional mobility and activity tolerance;  Session focused on progressive amb, with close monitor of activity tolerance, in prep for dc home; Walked more than household distances well without signs/symptoms of syncope; Discussed car transfers and completed stair training; OK for dc home from PT standpoint      If plan is discharge home, recommend the following: A little help with walking and/or transfers;A little help with bathing/dressing/bathroom;Assistance with cooking/housework;Assist for transportation;Help with stairs or ramp for entrance   Can travel by private vehicle        Equipment Recommendations  Rolling walker (2 wheels);BSC/3in1    Recommendations for Other Services       Precautions / Restrictions Precautions Precautions: Fall Precaution Comments: direct anterior THA Restrictions RLE Weight Bearing: Weight bearing as tolerated     Mobility  Bed Mobility Overal bed mobility: Needs Assistance Bed Mobility: Supine to Sit     Supine to sit: Supervision     General bed mobility comments: Movign slowly, and using LLE to assist RLE off of teh bed    Transfers Overall transfer level: Needs assistance Equipment used: Rolling walker (2 wheels) Transfers: Sit to/from Stand Sit to Stand: Supervision           General transfer comment: Slow rise, but steady    Ambulation/Gait Ambulation/Gait assistance: Supervision Gait Distance (Feet): 150 Feet Assistive device: Rolling walker (2 wheels) Gait Pattern/deviations: Step-through pattern (emerging)       General Gait Details: Overall managing well; cues to relax upper body; no reports of dizziness   Stairs Stairs:  Yes Stairs assistance: Min assist Stair Management: One rail Right (and handheld assist from husband) Number of Stairs: 10 General stair comments: Cues for sequence; Husband providing good assist   Wheelchair Mobility     Tilt Bed    Modified Rankin (Stroke Patients Only)       Balance     Sitting balance-Leahy Scale: Good       Standing balance-Leahy Scale: Poor                              Cognition Arousal: Alert Behavior During Therapy: WFL for tasks assessed/performed Overall Cognitive Status: Within Functional Limits for tasks assessed                                          Exercises      General Comments General comments (skin integrity, edema, etc.): No reports of dizziness      Pertinent Vitals/Pain Pain Assessment Pain Assessment: 0-10 Pain Score: 4  Pain Location: R hip incision Pain Descriptors / Indicators: Sore Pain Intervention(s): Monitored during session    Home Living                          Prior Function            PT Goals (current goals can now be found in the care plan section) Acute Rehab PT Goals Patient Stated Goal: to return to independence PT Goal Formulation: With patient Time  For Goal Achievement: 06/29/23 Potential to Achieve Goals: Good Progress towards PT goals: Progressing toward goals    Frequency    Min 1X/week      PT Plan      Co-evaluation              AM-PAC PT "6 Clicks" Mobility   Outcome Measure  Help needed turning from your back to your side while in a flat bed without using bedrails?: None Help needed moving from lying on your back to sitting on the side of a flat bed without using bedrails?: None Help needed moving to and from a bed to a chair (including a wheelchair)?: A Little Help needed standing up from a chair using your arms (e.g., wheelchair or bedside chair)?: A Little Help needed to walk in hospital room?: A Little Help needed  climbing 3-5 steps with a railing? : A Little 6 Click Score: 20    End of Session   Activity Tolerance: Patient tolerated treatment well Patient left: in bed;with call bell/phone within reach;with family/visitor present Nurse Communication: Mobility status PT Visit Diagnosis: Other abnormalities of gait and mobility (R26.89);Muscle weakness (generalized) (M62.81);Pain Pain - Right/Left: Right Pain - part of body: Hip     Time: 0928-0952 PT Time Calculation (min) (ACUTE ONLY): 24 min  Charges:    $Gait Training: 23-37 mins PT General Charges $$ ACUTE PT VISIT: 1 Visit                     Van Clines, PT  Acute Rehabilitation Services Office [phone] Secure Chat welcomed    Levi Aland 06/26/2023, 11:23 AM

## 2023-06-26 NOTE — Progress Notes (Signed)
Subjective: 1 Day Post-Op Procedure(s) (LRB): RIGHT TOTAL HIP ARTHROPLASTY ANTERIOR APPROACH (Right) Patient reports pain as moderate.    Objective: Vital signs in last 24 hours: Temp:  [97.6 F (36.4 C)-99.8 F (37.7 C)] 99.1 F (37.3 C) (12/11 0537) Pulse Rate:  [52-91] 87 (12/11 0537) Resp:  [10-20] 18 (12/11 0537) BP: (88-120)/(51-79) 113/71 (12/11 0537) SpO2:  [96 %-100 %] 100 % (12/11 0537)  Intake/Output from previous day: 12/10 0701 - 12/11 0700 In: 2340 [P.O.:1440; I.V.:900] Out: 1675 [Urine:1475; Blood:200] Intake/Output this shift: No intake/output data recorded.  Recent Labs    06/26/23 0655  HGB 9.3*   Recent Labs    06/26/23 0655  WBC 8.2  RBC 3.23*  HCT 29.0*  PLT 264   No results for input(s): "NA", "K", "CL", "CO2", "BUN", "CREATININE", "GLUCOSE", "CALCIUM" in the last 72 hours. No results for input(s): "LABPT", "INR" in the last 72 hours.  Sensation intact distally Intact pulses distally Dorsiflexion/Plantar flexion intact Incision: dressing C/D/I   Assessment/Plan: 1 Day Post-Op Procedure(s) (LRB): RIGHT TOTAL HIP ARTHROPLASTY ANTERIOR APPROACH (Right) Up with therapy Discharge home with home health      Kathryne Hitch 06/26/2023, 7:45 AM

## 2023-07-08 ENCOUNTER — Ambulatory Visit (INDEPENDENT_AMBULATORY_CARE_PROVIDER_SITE_OTHER): Payer: BC Managed Care – PPO | Admitting: Physician Assistant

## 2023-07-08 DIAGNOSIS — Z96641 Presence of right artificial hip joint: Secondary | ICD-10-CM

## 2023-07-08 MED ORDER — ASPIRIN 81 MG PO CHEW: 81.0000 mg | CHEWABLE_TABLET | Freq: Two times a day (BID) | ORAL | 0 refills | Status: DC

## 2023-07-08 NOTE — Progress Notes (Signed)
HPI: Ann Patton comes in today 2 weeks status post right total hip arthroplasty.  She is overall doing well.  She walks using a cane.  She denies any fevers chills.  She states her pain is moderate.  She is asking to go back on her Celebrex.  She is been taking aspirin twice daily for DVT prophylaxis.   Physical exam: General Well-developed well-nourished female no acute distress mood affect appropriate. Right hip surgical incision is well-approximated staples no signs of infection.  Slight swelling.  Attempted aspiration and this is a dry tap.  Right calf supple nontender.  Dorsiflexion plantarflexion right ankle intact.  Impression: Status post right total hip arthroplasty 06/25/2019  Plan: Steri-Strips applied after harvesting staples.  She is able to wash the incision in the shower.  She is to work on scar tissue mobilization.  Follow-up with [place] in 1 month sooner if there is any questions concerns.  Questions were encouraged and answered.

## 2023-08-05 ENCOUNTER — Encounter: Payer: Self-pay | Admitting: Orthopaedic Surgery

## 2023-08-05 ENCOUNTER — Ambulatory Visit (INDEPENDENT_AMBULATORY_CARE_PROVIDER_SITE_OTHER): Payer: 59 | Admitting: Orthopaedic Surgery

## 2023-08-05 DIAGNOSIS — Z96641 Presence of right artificial hip joint: Secondary | ICD-10-CM

## 2023-08-05 MED ORDER — METHOCARBAMOL 500 MG PO TABS: 500.0000 mg | ORAL_TABLET | Freq: Four times a day (QID) | ORAL | 1 refills | Status: DC | PRN

## 2023-08-05 NOTE — Progress Notes (Signed)
The patient at around 6 weeks status post a right total hip arthroplasty.  Since we have seen her she has gotten back to work.  She has travel to [place]  She reports good range of motion and strength and she is sleeping better.  She still reports some swelling and tightness but overall she is doing well.  She has a more normal-appearing gait on my exam today.  The hip is moving more smoothly and fluidly on the right side.  Her leg lengths appear near equal.  From my standpoint she will continue to increase her activities as comfort allows with no restrictions.  Will see her back in 6 months unless there are issues.  At that visit we will have a standing low AP pelvis and lateral of her right operative hip.  I will send in some more Robaxin for her as well.

## 2023-09-16 ENCOUNTER — Telehealth: Payer: Self-pay | Admitting: Orthopaedic Surgery

## 2023-09-16 ENCOUNTER — Other Ambulatory Visit: Payer: Self-pay

## 2023-09-16 MED ORDER — CELECOXIB 200 MG PO CAPS: 200.0000 mg | ORAL_CAPSULE | Freq: Every day | ORAL | 1 refills | Status: DC

## 2023-09-16 NOTE — Telephone Encounter (Signed)
 Patient called would like Celebrex called in for her.

## 2023-09-16 NOTE — Telephone Encounter (Signed)
Sent in and patient aware 

## 2023-10-07 ENCOUNTER — Ambulatory Visit: Admitting: Physician Assistant

## 2023-10-07 ENCOUNTER — Other Ambulatory Visit (INDEPENDENT_AMBULATORY_CARE_PROVIDER_SITE_OTHER): Payer: Self-pay

## 2023-10-07 VITALS — Ht 64.17 in | Wt 167.0 lb

## 2023-10-07 DIAGNOSIS — M25552 Pain in left hip: Secondary | ICD-10-CM | POA: Diagnosis not present

## 2023-10-07 MED ORDER — DICLOFENAC SODIUM 75 MG PO TBEC
75.0000 mg | DELAYED_RELEASE_TABLET | Freq: Two times a day (BID) | ORAL | 2 refills | Status: DC
Start: 2023-10-07 — End: 2024-02-03

## 2023-10-07 NOTE — Progress Notes (Signed)
 Office Visit Note   Patient: Ann Patton           Date of Birth: 1970/12/31           MRN: [id number] Visit Date: 10/07/2023              Requested by: Koren Shiver, DO [address] [address] [place],  [place] [address] PCP: Koren Shiver, DO   Assessment & Plan: Visit Diagnoses:  1. Pain in left hip     Plan: Will refill her diclofenac 75 mg p.o. twice daily.  No other NSAIDs while on the diclofenac and this is discussed.  She will stop the Celebrex.  Will also send her for an intra-articular injection left hip with Dr. Shon Baton.  She will follow-up with Dr. Magnus Ivan as scheduled for 58-month postop visit for the right hip.  Questions were encouraged and answered at length.  Follow-Up Instructions: Return in about 3 months (around 01/07/2024).   Orders:  Orders Placed This Encounter  Procedures   XR HIP UNILAT W OR W/O PELVIS 2-3 VIEWS LEFT   AMB referral to sports medicine   No orders of the defined types were placed in this encounter.     Procedures: No procedures performed   Clinical Data: No additional findings.   Subjective: Chief Complaint  Patient presents with   Left Hip - Pain    HPI Aurther Loft returns today due to left hip pain.  She states she has had left hip pain for the last 2 weeks.  States this feels like her right hip before she underwent total hip arthroplasty.  She is now 3-1/2 months status post right total hip arthroplasty.  Right hip is doing well.  She had no injury.  Pain worse in the morning with walking described as sharp pain with some grinding.  Has pain lifting the leg crossing her legs.  She has tried Celebrex without any relief.  She had some old diclofenac that she took and this was helpful.  Review of Systems  Constitutional:  Negative for chills and fever.     Objective: Vital Signs: Ht 5' 4.17" (1.63 m)   Wt 167 lb (75.8 kg)   LMP 03/13/2018 Comment: negative urine preg test 04/09/18  BMI 28.51 kg/m    Physical Exam Constitutional:      Appearance: She is normal weight. She is not ill-appearing or diaphoretic.  Pulmonary:     Effort: Pulmonary effort is normal.  Neurological:     Mental Status: She is alert and oriented to person, place, and time.  Psychiatric:        Mood and Affect: Mood normal.     Ortho Exam Right hip excellent range of motion without pain.  Left hip good range of motion no blocks.  However she has pain with extremes of internal/external rotation. Specialty Comments:  No specialty comments available.  Imaging: XR HIP UNILAT W OR W/O PELVIS 2-3 VIEWS LEFT Result Date: 10/07/2023 Left hip lateral view and AP pelvis: No acute fractures.  Right total hip arthroplasty appears well-seated no complicating features.  Left hip compared to radiographs postop and a standing film 6 months ago showed decreased joint space with moderate hip arthritic changes compared to wide open joint space 6 months ago.  No acute fractures.  No evidence of AVN.  Bilateral hips well located.    PMFS History: Patient Active Problem List   Diagnosis Date Noted   OA (osteoarthritis) of hip 06/25/2023  Status post total replacement of right hip 06/25/2023   Unilateral primary osteoarthritis, right hip 03/11/2023   Genetic testing 05/26/2018   Family history of breast cancer    Family history of ovarian cancer    Family history of lung cancer    Family history of melanoma    Family history of leukemia    Iron deficiency anemia 02/26/2018   Ductal carcinoma in situ (DCIS) of right breast 02/12/2018   Past Medical History:  Diagnosis Date   Anemia    Anxiety    Arthritis    Asthma    mild   Breast cancer (HCC)    Depression    Family history of breast cancer    Family history of leukemia    Family history of lung cancer    Family history of melanoma    Family history of ovarian cancer    Personal history of radiation therapy    Skin cancer     Family History  Problem  Relation Age of Onset   Hypertension Mother    Ovarian cancer Mother 72   Lung cancer Mother 57   Skin cancer Father        Melanoma   Breast cancer Paternal Grandmother        dx 81s, recurrence in 41s   Lung cancer Maternal Aunt        d. 40s   Leukemia Paternal Aunt    Skin cancer Maternal Grandmother     Past Surgical History:  Procedure Laterality Date   BREAST BIOPSY Right 2019   BREAST LUMPECTOMY Right 2019   BREAST LUMPECTOMY WITH RADIOACTIVE SEED LOCALIZATION Right 04/09/2018   Procedure: RIGHT BREAST LUMPECTOMY WITH RADIOACTIVE SEED LOCALIZATION;  Surgeon: Glenna Fellows, MD;  Location: [facility];  Service: General;  Laterality: Right;   c sections     COLONOSCOPY     EXCISION MASS HEAD Bilateral 04/24/2023   Procedure: EXCISION OF MOHS DEFECT OF NOSE WITH REPAIR AND RECONSTRUCTION;  Surgeon: Scarlette Ar, MD;  Location: [facility];  Service: ENT;  Laterality: Bilateral;   LUMBAR MICRODISCECTOMY  2001   SKIN FULL THICKNESS GRAFT Left 04/24/2023   Procedure: SKIN GRAFT FROM LEFT EAR;  Surgeon: Scarlette Ar, MD;  Location: [facility];  Service: ENT;  Laterality: Left;   TOTAL HIP ARTHROPLASTY Right 06/25/2023   Procedure: RIGHT TOTAL HIP ARTHROPLASTY ANTERIOR APPROACH;  Surgeon: Kathryne Hitch, MD;  Location: [facility];  Service: Orthopedics;  Laterality: Right;   TUBAL LIGATION     Social History   Occupational History   Not on file  Tobacco Use   Smoking status: Former    Current packs/day: 0.25    Types: Cigarettes   Smokeless tobacco: Never  Vaping Use   Vaping status: Never Used  Substance and Sexual Activity   Alcohol use: Yes    Alcohol/week: 14.0 standard drinks of alcohol    Types: 14 Cans of beer per week    Comment: 2 beers /week   Drug use: Never   Sexual activity: Yes    Birth control/protection: Surgical

## 2023-10-21 ENCOUNTER — Other Ambulatory Visit: Payer: Self-pay

## 2023-10-21 ENCOUNTER — Ambulatory Visit: Admitting: Sports Medicine

## 2023-10-21 ENCOUNTER — Encounter: Payer: Self-pay | Admitting: Sports Medicine

## 2023-10-21 DIAGNOSIS — M1612 Unilateral primary osteoarthritis, left hip: Secondary | ICD-10-CM

## 2023-10-21 DIAGNOSIS — M25552 Pain in left hip: Secondary | ICD-10-CM | POA: Diagnosis not present

## 2023-10-21 MED ORDER — LIDOCAINE HCL 1 % IJ SOLN
4.0000 mL | INTRAMUSCULAR | Status: AC | PRN
  Administered 2023-10-21: 4 mL

## 2023-10-21 MED ORDER — METHYLPREDNISOLONE ACETATE 40 MG/ML IJ SUSP
80.0000 mg | INTRAMUSCULAR | Status: AC | PRN
  Administered 2023-10-21: 80 mg via INTRA_ARTICULAR

## 2023-10-21 NOTE — Progress Notes (Signed)
   Procedure Note  Patient: Ann Patton             Date of Birth: 1970/10/08           MRN: [id number]             Visit Date: 10/21/2023  Procedures: Visit Diagnoses:  1. Unilateral primary osteoarthritis, left hip   2. Pain in left hip    Large Joint Inj: L hip joint on 10/21/2023 9:41 AM Indications: pain Details: 22 G 3.5 in needle, ultrasound-guided anterior approach Medications: 4 mL lidocaine 1 %; 80 mg methylPREDNISolone acetate 40 MG/ML Outcome: tolerated well, no immediate complications  Procedure: US-guided intra-articular hip injection, Left After discussion on risks/benefits/indications and informed verbal consent was obtained, a timeout was performed. Patient was lying supine on exam table. The hip was cleaned with betadine and alcohol swabs. Then utilizing ultrasound guidance, the patient's femoral head and neck junction was identified and subsequently injected with 4:2 lidocaine:depomedrol via an in-plane approach with ultrasound visualization of the injectate administered into the hip joint. Patient tolerated procedure well without immediate complications.  Procedure, treatment alternatives, risks and benefits explained, specific risks discussed. Consent was given by the patient. Immediately prior to procedure a time out was called to verify the correct patient, procedure, equipment, support staff and site/side marked as required. Patient was prepped and draped in the usual sterile fashion.     - f/u with Dr. Magnus Ivan as indicated; I am happy to see her as needed  - post-injection protocol discussed  Madelyn Brunner, DO Primary Care Sports Medicine Physician  [facility]  This note was dictated using Dragon naturally speaking software and may contain errors in syntax, spelling, or content which have not been identified prior to signing this note.

## 2024-02-03 ENCOUNTER — Ambulatory Visit: Payer: 59 | Admitting: Orthopaedic Surgery

## 2024-02-03 ENCOUNTER — Other Ambulatory Visit (INDEPENDENT_AMBULATORY_CARE_PROVIDER_SITE_OTHER): Payer: Self-pay

## 2024-02-03 DIAGNOSIS — Z96641 Presence of right artificial hip joint: Secondary | ICD-10-CM

## 2024-02-03 DIAGNOSIS — M1612 Unilateral primary osteoarthritis, left hip: Secondary | ICD-10-CM

## 2024-02-03 MED ORDER — DICLOFENAC SODIUM 75 MG PO TBEC: 75.0000 mg | DELAYED_RELEASE_TABLET | Freq: Two times a day (BID) | ORAL | 2 refills | Status: DC | PRN

## 2024-02-03 NOTE — Progress Notes (Signed)
 Ann Patton comes in today at 7 months status post a right total hip arthroplasty to treat severe arthritis in the right hip that was bone-on-bone.  She is only 53 years old.  Over the last 6 months she has had worsening left hip pain.  She has now had at least 2 intra-articular steroid injections of the left hip and they are no longer helpful for her.  Her x-rays last year showed a well-maintained normal-appearing left joint space.  At this point her left hip pain is daily and is definitely affecting her mobility, her quality life and her actives daily living.  On exam her right operative hip moves smoothly and fluidly and is great and pain-free.  Her left hip has significant pain with internal and external rotation and significant stiffness with rotation.  We reviewed films from last year and then again in March of this year and today.  She has rapidly worsening arthritis of her left hip that has become now bone-on-bone with complete loss of joint space which is quite surprising.  We will refill her diclofenac .  I would not recommend any more steroid injections for her left hip because this would be more detrimental at this standpoint.  She will continue to work on hip strengthening exercises and is wanting to consider hip replacement left side in December of this year if she can last that long.  With that being said we will see her back in 2 months to see how she is doing overall but if things get rapidly worse with her left hip we will move surgery up sooner.  Therapy at this point will not be beneficial at all as an outpatient for PT on that left hip considering how bad the arthritis is at this standpoint.  All question concerns were addressed and answered.

## 2024-02-10 ENCOUNTER — Encounter: Payer: Self-pay | Admitting: Orthopaedic Surgery

## 2024-02-28 NOTE — Telephone Encounter (Signed)
I called patient and left voice mail for return call. °

## 2024-03-27 ENCOUNTER — Telehealth: Payer: Self-pay

## 2024-04-08 ENCOUNTER — Ambulatory Visit: Admitting: Orthopaedic Surgery

## 2024-04-08 ENCOUNTER — Encounter: Payer: Self-pay | Admitting: Orthopaedic Surgery

## 2024-04-08 DIAGNOSIS — M1612 Unilateral primary osteoarthritis, left hip: Secondary | ICD-10-CM

## 2024-04-08 DIAGNOSIS — M25552 Pain in left hip: Secondary | ICD-10-CM

## 2024-04-08 NOTE — Progress Notes (Signed)
 The patient is a 53 year old female well-known to us .  We replaced her right hip back in December 2024.  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  This has been well-documented showing x-rays with bone-on-bone wear of her left hip that has gotten significantly worse.  She has had several steroid injections with time out in the left hip and has worked on activity modification as well as has been through therapy with quad strengthening and hip strengthening exercises.  At this point her left hip pain is daily and it is detrimentally affecting her mobility, quality of life and her actives daily living to the point she does wish to proceed with hip replacement surgery.  She is not obese.  She is not diabetic.  She is not on any blood thinning medications and has no significant medical issues at all.  On exam her right hip moves smoothly and fluidly with no blocks or rotation from her hip replacement.  Her left hip has significant stiffness and severe pain in the groin with any attempts of internal and external rotation.  Again her previous x-rays show bone-on-bone wear of the left hip.  This point given the failure of all forms of conservative treatment for over 6 months now for her left hip severe arthritis, we are recommending a total hip arthroplasty and she does wish to proceed.  Having had this before and her right side she is fully aware what the surgery involves as well as what the risk and benefits of surgery are with hip replacement surgery.  We will work on getting her scheduled and be in touch with scheduling her for left total hip replacement.

## 2024-04-20 NOTE — Telephone Encounter (Signed)
 error

## 2024-05-06 ENCOUNTER — Other Ambulatory Visit: Payer: Self-pay | Admitting: Physician Assistant

## 2024-05-06 DIAGNOSIS — Z01818 Encounter for other preprocedural examination: Secondary | ICD-10-CM

## 2024-05-06 NOTE — Progress Notes (Signed)
 Sent message, via epic in basket, requesting orders in epic from Careers adviser.

## 2024-05-07 ENCOUNTER — Other Ambulatory Visit: Payer: Self-pay | Admitting: Orthopaedic Surgery

## 2024-05-13 ENCOUNTER — Other Ambulatory Visit: Payer: Self-pay

## 2024-05-13 ENCOUNTER — Encounter (HOSPITAL_COMMUNITY)
Admission: RE | Admit: 2024-05-13 | Discharge: 2024-05-13 | Disposition: A | Discharge status: Home / Self Care | Source: Ambulatory Visit | Attending: Orthopaedic Surgery | Admitting: Orthopaedic Surgery

## 2024-05-13 ENCOUNTER — Encounter (HOSPITAL_COMMUNITY): Payer: Self-pay

## 2024-05-13 DIAGNOSIS — Z01812 Encounter for preprocedural laboratory examination: Secondary | ICD-10-CM | POA: Insufficient documentation

## 2024-05-13 DIAGNOSIS — Z01818 Encounter for other preprocedural examination: Secondary | ICD-10-CM

## 2024-05-13 LAB — CBC
HCT: 32.5 % — ABNORMAL LOW (ref 36.0–46.0)
Hemoglobin: 9.8 g/dL — ABNORMAL LOW (ref 12.0–15.0)
MCH: 27.1 pg (ref 26.0–34.0)
MCHC: 30.2 g/dL (ref 30.0–36.0)
MCV: 89.8 fL (ref 80.0–100.0)
Platelets: 294 K/uL (ref 150–400)
RBC: 3.62 MIL/uL — ABNORMAL LOW (ref 3.87–5.11)
RDW: 14.6 % (ref 11.5–15.5)
WBC: 3.6 K/uL — ABNORMAL LOW (ref 4.0–10.5)
nRBC: 0 % (ref 0.0–0.2)

## 2024-05-13 LAB — SURGICAL PCR SCREEN
MRSA, PCR: NEGATIVE
Staphylococcus aureus: NEGATIVE

## 2024-05-13 LAB — BASIC METABOLIC PANEL WITH GFR
Anion gap: 11 (ref 5–15)
BUN: 13 mg/dL (ref 6–20)
CO2: 25 mmol/L (ref 22–32)
Calcium: 9.3 mg/dL (ref 8.9–10.3)
Chloride: 101 mmol/L (ref 98–111)
Creatinine, Ser: 0.91 mg/dL (ref 0.44–1.00)
GFR, Estimated: >60 mL/min (ref >60)
Glucose, Bld: 119 mg/dL — ABNORMAL HIGH (ref 70–99)
Potassium: 4.5 mmol/L (ref 3.5–5.1)
Sodium: 136 mmol/L (ref 135–145)

## 2024-05-13 NOTE — Patient Instructions (Addendum)
 SURGICAL WAITING ROOM VISITATION  Patients having surgery or a procedure may have no more than 2 support people in the waiting area - these visitors may rotate.    Children under the age of 75 must have an adult with them who is not the patient.  Visitors with respiratory illnesses are discouraged from visiting and should remain at home.  If the patient needs to stay at the hospital during part of their recovery, the visitor guidelines for inpatient rooms apply. Pre-op nurse will coordinate an appropriate time for 1 support person to accompany patient in pre-op.  This support person may not rotate.    Please refer to the [facility] website for the visitor guidelines for Inpatients (after your surgery is over and you are in a regular room).    Your procedure is scheduled on: 05/22/24   Report to [facility] Main Entrance    Report to admitting at 5:15 AM   Call this number if you have problems the morning of surgery [phone]   Do not eat food :After Midnight.   After Midnight you may have the following liquids until 4:15 AM DAY OF SURGERY  Water Non-Citrus Juices (without pulp, NO RED-Apple, White grape, White cranberry) Black Coffee (NO MILK/CREAM OR CREAMERS, sugar ok)  Clear Tea (NO MILK/CREAM OR CREAMERS, sugar ok) regular and decaf                             Plain Jell-O (NO RED)                                           Fruit ices (not with fruit pulp, NO RED)                                     Popsicles (NO RED)                                                               Sports drinks like Gatorade (NO RED)               The day of surgery:  Drink ONE (1) Pre-Surgery Clear Ensure at 4:15 AM the morning of surgery. Drink in one sitting. Do not sip.  This drink was given to you during your hospital  pre-op appointment visit. Nothing else to drink after completing the  Pre-Surgery Clear Ensure.          If you have questions, please contact your surgeon's  office.   FOLLOW BOWEL PREP AND ANY ADDITIONAL PRE OP INSTRUCTIONS YOU RECEIVED FROM YOUR SURGEON'S OFFICE!!!     Oral Hygiene is also important to reduce your risk of infection.                                    Remember - BRUSH YOUR TEETH THE MORNING OF SURGERY WITH YOUR REGULAR TOOTHPASTE  DENTURES WILL BE REMOVED PRIOR TO SURGERY PLEASE DO NOT APPLY Poly grip OR ADHESIVES!!!  Stop all vitamins and herbal supplements 7 days before surgery.   Take these medicines the morning of surgery with A SIP OF WATER: Inhalers   DO NOT TAKE ANY ORAL DIABETIC MEDICATIONS DAY OF YOUR SURGERY  Bring CPAP mask and tubing day of surgery.                              You may not have any metal on your body including hair pins, jewelry, and body piercing             Do not wear make-up, lotions, powders, perfumes/cologne, or deodorant  Do not wear nail polish including gel and S&S, artificial/acrylic nails, or any other type of covering on natural nails including finger and toenails. If you have artificial nails, gel coating, etc. that needs to be removed by a nail salon please have this removed prior to surgery or surgery may need to be canceled/ delayed if the surgeon/ anesthesia feels like they are unable to be safely monitored.   Do not shave  48 hours prior to surgery.    Do not bring valuables to the hospital. [facility] IS NOT             RESPONSIBLE   FOR VALUABLES.   Contacts, glasses, dentures or bridgework may not be worn into surgery.   Bring small overnight bag day of surgery.   DO NOT BRING YOUR HOME MEDICATIONS TO THE HOSPITAL. PHARMACY WILL DISPENSE MEDICATIONS LISTED ON YOUR MEDICATION LIST TO YOU DURING YOUR ADMISSION IN THE HOSPITAL!    Patients discharged on the day of surgery will not be allowed to drive home.  Someone NEEDS to stay with you for the first 24 hours after anesthesia.              Please read over the following fact sheets you were given: IF YOU HAVE  QUESTIONS ABOUT YOUR PRE-OP INSTRUCTIONS PLEASE CALL [phone].   If you received a COVID test during your pre-op visit  it is requested that you wear a mask when out in public, stay away from anyone that may not be feeling well and notify your surgeon if you develop symptoms. If you test positive for Covid or have been in contact with anyone that has tested positive in the last 10 days please notify you surgeon.      Pre-operative 4 CHG Bath Instructions  DYNA-Hex 4 Chlorhexidine  Gluconate 4% Solution Antiseptic 4 fl. oz   You can play a key role in reducing the risk of infection after surgery. Your skin needs to be as free of germs as possible. You can reduce the number of germs on your skin by washing with CHG (chlorhexidine  gluconate) soap before surgery. CHG is an antiseptic soap that kills germs and continues to kill germs even after washing.   DO NOT use if you have an allergy to chlorhexidine /CHG or antibacterial soaps. If your skin becomes reddened or irritated, stop using the CHG and notify one of our RNs at   Please shower with the CHG soap starting 4 days before surgery using the following schedule:     Please keep in mind the following:  DO NOT shave, including legs and underarms, starting the day of your first shower.   You may shave your face at any point before/day of surgery.  Place clean sheets on your bed the day you start using CHG soap. Use a clean washcloth (  not used since being washed) for each shower. DO NOT sleep with pets once you start using the CHG.  CHG Shower Instructions:  If you choose to wash your hair and private area, wash first with your normal shampoo/soap.  After you use shampoo/soap, rinse your hair and body thoroughly to remove shampoo/soap residue.  Turn the water OFF and apply about 3 tablespoons (45 ml) of CHG soap to a CLEAN washcloth.  Apply CHG soap ONLY FROM YOUR NECK DOWN TO YOUR TOES (washing for 3-5 minutes)  DO NOT use CHG  soap on face, private areas, open wounds, or sores.  Pay special attention to the area where your surgery is being performed.  If you are having back surgery, having someone wash your back for you may be helpful. Wait 2 minutes after CHG soap is applied, then you may rinse off the CHG soap.  Pat dry with a clean towel  Put on clean clothes/pajamas   If you choose to wear lotion, please use ONLY the CHG-compatible lotions on the back of this paper.     Additional instructions for the day of surgery: DO NOT APPLY any lotions, deodorants, cologne, or perfumes.   Put on clean/comfortable clothes.  Brush your teeth.  Ask your nurse before applying any prescription medications to the skin.   CHG Compatible Lotions   Aveeno Moisturizing lotion  Cetaphil Moisturizing Cream  Cetaphil Moisturizing Lotion  Clairol Herbal Essence Moisturizing Lotion, Dry Skin  Clairol Herbal Essence Moisturizing Lotion, Extra Dry Skin  Clairol Herbal Essence Moisturizing Lotion, Normal Skin  Curel Age Defying Therapeutic Moisturizing Lotion with Alpha Hydroxy  Curel Extreme Care Body Lotion  Curel Soothing Hands Moisturizing Hand Lotion  Curel Therapeutic Moisturizing Cream, Fragrance-Free  Curel Therapeutic Moisturizing Lotion, Fragrance-Free  Curel Therapeutic Moisturizing Lotion, Original Formula  Eucerin Daily Replenishing Lotion  Eucerin Dry Skin Therapy Plus Alpha Hydroxy Crme  Eucerin Dry Skin Therapy Plus Alpha Hydroxy Lotion  Eucerin Original Crme  Eucerin Original Lotion  Eucerin Plus Crme Eucerin Plus Lotion  Eucerin TriLipid Replenishing Lotion  Keri Anti-Bacterial Hand Lotion  Keri Deep Conditioning Original Lotion Dry Skin Formula Softly Scented  Keri Deep Conditioning Original Lotion, Fragrance Free Sensitive Skin Formula  Keri Lotion Fast Absorbing Fragrance Free Sensitive Skin Formula  Keri Lotion Fast Absorbing Softly Scented Dry Skin Formula  Keri Original Lotion  Keri Skin Renewal  Lotion Keri Silky Smooth Lotion  Keri Silky Smooth Sensitive Skin Lotion  Nivea Body Creamy Conditioning Oil  Nivea Body Extra Enriched Lotion  Nivea Body Original Lotion  Nivea Body Sheer Moisturizing Lotion Nivea Crme  Nivea Skin Firming Lotion  NutraDerm 30 Skin Lotion  NutraDerm Skin Lotion  NutraDerm Therapeutic Skin Cream  NutraDerm Therapeutic Skin Lotion  ProShield Protective Hand Cream  Provon moisturizing lotion  View Pre-Surgery Education Videos:  [email or web address]     Incentive Spirometer  An incentive spirometer is a tool that can help keep your lungs clear and active. This tool measures how well you are filling your lungs with each breath. Taking long deep breaths may help reverse or decrease the chance of developing breathing (pulmonary) problems (especially infection) following: A long period of time when you are unable to move or be active. BEFORE THE PROCEDURE  If the spirometer includes an indicator to show your best effort, your nurse or respiratory therapist will set it to a desired goal. If possible, sit up straight or lean slightly forward. Try not to slouch. Hold the incentive spirometer in an  upright position. INSTRUCTIONS FOR USE  Sit on the edge of your bed if possible, or sit up as far as you can in bed or on a chair. Hold the incentive spirometer in an upright position. Breathe out normally. Place the mouthpiece in your mouth and seal your lips tightly around it. Breathe in slowly and as deeply as possible, raising the piston or the ball toward the top of the column. Hold your breath for 3-5 seconds or for as long as possible. Allow the piston or ball to fall to the bottom of the column. Remove the mouthpiece from your mouth and breathe out normally. Rest for a few seconds and repeat Steps 1 through 7 at least 10 times every 1-2 hours when you are awake. Take your time and take a few normal  breaths between deep breaths. The spirometer may include an indicator to show your best effort. Use the indicator as a goal to work toward during each repetition. After each set of 10 deep breaths, practice coughing to be sure your lungs are clear. If you have an incision (the cut made at the time of surgery), support your incision when coughing by placing a pillow or rolled up towels firmly against it. Once you are able to get out of bed, walk around indoors and cough well. You may stop using the incentive spirometer when instructed by your caregiver.  RISKS AND COMPLICATIONS Take your time so you do not get dizzy or light-headed. If you are in pain, you may need to take or ask for pain medication before doing incentive spirometry. It is harder to take a deep breath if you are having pain. AFTER USE Rest and breathe slowly and easily. It can be helpful to keep track of a log of your progress. Your caregiver can provide you with a simple table to help with this. If you are using the spirometer at home, follow these instructions: SEEK MEDICAL CARE IF:  You are having difficultly using the spirometer. You have trouble using the spirometer as often as instructed. Your pain medication is not giving enough relief while using the spirometer. You develop fever of 100.5 F (38.1 C) or higher. SEEK IMMEDIATE MEDICAL CARE IF:  You cough up bloody sputum that had not been present before. You develop fever of 102 F (38.9 C) or greater. You develop worsening pain at or near the incision site. MAKE SURE YOU:  Understand these instructions. Will watch your condition. Will get help right away if you are not doing well or get worse. Document Released: 11/12/2006 Document Revised: 09/24/2011 Document Reviewed: 01/13/2007 Exit[employer] Patient Information 2014 [employer], [place].   ________________________________________________________________________ WHAT IS A BLOOD TRANSFUSION? Blood Transfusion  Information  A transfusion is the replacement of blood or some of its parts. Blood is made up of multiple cells which provide different functions. Red blood cells carry oxygen and are used for blood loss replacement. White blood cells fight against infection. Platelets control bleeding. Plasma helps clot blood. Other blood products are available for specialized needs, such as hemophilia or other clotting disorders. BEFORE THE TRANSFUSION  Who gives blood for transfusions?  Healthy volunteers who are fully evaluated to make sure their blood is safe. This is blood bank blood. Transfusion therapy is the safest it has ever been in the practice of medicine. Before blood is taken from a donor, a complete history is taken to make sure that person has no history of diseases nor engages in risky social behavior (examples are intravenous  drug use or sexual activity with multiple partners). The donor's travel history is screened to minimize risk of transmitting infections, such as malaria. The donated blood is tested for signs of infectious diseases, such as HIV and hepatitis. The blood is then tested to be sure it is compatible with you in order to minimize the chance of a transfusion reaction. If you or a relative donates blood, this is often done in anticipation of surgery and is not appropriate for emergency situations. It takes many days to process the donated blood. RISKS AND COMPLICATIONS Although transfusion therapy is very safe and saves many lives, the main dangers of transfusion include:  Getting an infectious disease. Developing a transfusion reaction. This is an allergic reaction to something in the blood you were given. Every precaution is taken to prevent this. The decision to have a blood transfusion has been considered carefully by your caregiver before blood is given. Blood is not given unless the benefits outweigh the risks. AFTER THE TRANSFUSION Right after receiving a blood transfusion,  you will usually feel much better and more energetic. This is especially true if your red blood cells have gotten low (anemic). The transfusion raises the level of the red blood cells which carry oxygen, and this usually causes an energy increase. The nurse administering the transfusion will monitor you carefully for complications. HOME CARE INSTRUCTIONS  No special instructions are needed after a transfusion. You may find your energy is better. Speak with your caregiver about any limitations on activity for underlying diseases you may have. SEEK MEDICAL CARE IF:  Your condition is not improving after your transfusion. You develop redness or irritation at the intravenous (IV) site. SEEK IMMEDIATE MEDICAL CARE IF:  Any of the following symptoms occur over the next 12 hours: Shaking chills. You have a temperature by mouth above 102 F (38.9 C), not controlled by medicine. Chest, back, or muscle pain. People around you feel you are not acting correctly or are confused. Shortness of breath or difficulty breathing. Dizziness and fainting. You get a rash or develop hives. You have a decrease in urine output. Your urine turns a dark color or changes to pink, red, or brown. Any of the following symptoms occur over the next 10 days: You have a temperature by mouth above 102 F (38.9 C), not controlled by medicine. Shortness of breath. Weakness after normal activity. The white part of the eye turns yellow (jaundice). You have a decrease in the amount of urine or are urinating less often. Your urine turns a dark color or changes to pink, red, or brown. Document Released: 06/29/2000 Document Revised: 09/24/2011 Document Reviewed: 02/16/2008 [facility] Patient Information 2014 [place], [place].  _______________________________________________________________________

## 2024-05-13 NOTE — Progress Notes (Addendum)
 COVID Vaccine Completed:  Date of COVID positive in last 90 days:  PCP - Mliss Chill, MD Cardiologist - n/a  Chest x-ray - N/A EKG - N/A Stress Test - N/A ECHO - N/A Cardiac Cath - n/a Pacemaker/ICD device last checked:N/A Spinal Cord Stimulator:N/A  Bowel Prep - N/A  Sleep Study - N/A CPAP -   Fasting Blood Sugar - N/A Checks Blood Sugar _____ times a day  Last dose of GLP1 agonist-  N/A GLP1 instructions:  Do not take after     Last dose of SGLT-2 inhibitors-  N/A SGLT-2 instructions:  Do not take after     Blood Thinner Instructions: N/A Last dose:   Time: Aspirin  Instructions:N/A Last Dose:  Activity level: Can go up a flight of stairs and perform activities of daily living without stopping and without symptoms of chest pain or shortness of breath.   Anesthesia review: Hgb 9.8, anemia  Patient denies shortness of breath, fever, cough and chest pain at PAT appointment  Patient verbalized understanding of instructions that were given to them at the PAT appointment. Patient was also instructed that they will need to review over the PAT instructions again at home before surgery.

## 2024-05-18 ENCOUNTER — Encounter: Payer: Self-pay | Admitting: Radiology

## 2024-05-21 NOTE — Anesthesia Preprocedure Evaluation (Signed)
 Anesthesia Evaluation  Patient identified by MRN, date of birth, ID band Patient awake    Reviewed: Allergy & Precautions, H&P , NPO status , Patient's Chart, lab work & pertinent test results  Airway Mallampati: II  TM Distance: >3 FB Neck ROM: Full    Dental no notable dental hx.    Pulmonary asthma , neg sleep apnea, neg COPD, former smoker   Pulmonary exam normal breath sounds clear to auscultation       Cardiovascular (-) angina (-) Past MI negative cardio ROS Normal cardiovascular exam Rhythm:Regular Rate:Normal     Neuro/Psych neg Seizures PSYCHIATRIC DISORDERS Anxiety Depression    negative neurological ROS     GI/Hepatic negative GI ROS, Neg liver ROS,,,  Endo/Other  negative endocrine ROS    Renal/GU negative Renal ROS  negative genitourinary   Musculoskeletal  (+) Arthritis ,    Abdominal   Peds negative pediatric ROS (+)  Hematology negative hematology ROS (+)   Anesthesia Other Findings Plt 294  Reproductive/Obstetrics Right sided breast cancer                              Anesthesia Physical Anesthesia Plan  ASA: 2  Anesthesia Plan: Spinal   Post-op Pain Management: Tylenol  PO (pre-op)*   Induction:   PONV Risk Score and Plan: 2 and Treatment may vary due to age or medical condition  Airway Management Planned: Natural Airway  Additional Equipment: None  Intra-op Plan:   Post-operative Plan:   Informed Consent: I have reviewed the patients History and Physical, chart, labs and discussed the procedure including the risks, benefits and alternatives for the proposed anesthesia with the patient or authorized representative who has indicated his/her understanding and acceptance.     Dental advisory given  Plan Discussed with: CRNA  Anesthesia Plan Comments:          Anesthesia Quick Evaluation

## 2024-05-21 NOTE — H&P (Signed)
 TOTAL HIP ADMISSION H&P  Patient is admitted for left total hip arthroplasty.  Subjective:  Chief Complaint: left hip pain  HPI: Ann Patton, 53 y.o. female, has a history of pain and functional disability in the left hip(s) due to arthritis and patient has failed non-surgical conservative treatments for greater than 12 weeks to include NSAID's and/or analgesics, corticosteriod injections, flexibility and strengthening excercises, use of assistive devices, and activity modification.  Onset of symptoms was gradual starting 1 years ago with gradually worsening course since that time.The patient noted no past surgery on the left hip(s).  Patient currently rates pain in the left hip at 10 out of 10 with activity. Patient has night pain, worsening of pain with activity and weight bearing, pain that interfers with activities of daily living, and pain with passive range of motion. Patient has evidence of subchondral sclerosis, periarticular osteophytes, and joint space narrowing by imaging studies. This condition presents safety issues increasing the risk of falls.  There is no current active infection.  Patient Active Problem List   Diagnosis Date Noted   Unilateral primary osteoarthritis, left hip 06/25/2023   Status post total replacement of right hip 06/25/2023   Genetic testing 05/26/2018   Family history of breast cancer    Family history of ovarian cancer    Family history of lung cancer    Family history of melanoma    Family history of leukemia    Iron deficiency anemia 02/26/2018   Ductal carcinoma in situ (DCIS) of right breast 02/12/2018   Past Medical History:  Diagnosis Date   Anemia    Anxiety    Arthritis    Asthma    mild   Breast cancer (HCC)    right breast   Depression    Family history of breast cancer    Family history of leukemia    Family history of lung cancer    Family history of melanoma    Family history of ovarian cancer    Personal history of  radiation therapy    Skin cancer     Past Surgical History:  Procedure Laterality Date   BREAST BIOPSY Right 2019   BREAST LUMPECTOMY Right 2019   BREAST LUMPECTOMY WITH RADIOACTIVE SEED LOCALIZATION Right 04/09/2018   Procedure: RIGHT BREAST LUMPECTOMY WITH RADIOACTIVE SEED LOCALIZATION;  Surgeon: Mikell Katz, MD;  Location: [facility];  Service: General;  Laterality: Right;   c sections     x2   COLONOSCOPY     EXCISION MASS HEAD Bilateral 04/24/2023   Procedure: EXCISION OF MOHS DEFECT OF NOSE WITH REPAIR AND RECONSTRUCTION;  Surgeon: Luciano Standing, MD;  Location: [facility];  Service: ENT;  Laterality: Bilateral;   LUMBAR MICRODISCECTOMY  2001   SKIN FULL THICKNESS GRAFT Left 04/24/2023   Procedure: SKIN GRAFT FROM LEFT EAR;  Surgeon: Luciano Standing, MD;  Location: [facility];  Service: ENT;  Laterality: Left;   TOTAL HIP ARTHROPLASTY Right 06/25/2023   Procedure: RIGHT TOTAL HIP ARTHROPLASTY ANTERIOR APPROACH;  Surgeon: Vernetta Lonni GRADE, MD;  Location: [facility];  Service: Orthopedics;  Laterality: Right;   TUBAL LIGATION      No current facility-administered medications for this encounter.   Current Outpatient Medications  Medication Sig Dispense Refill Last Dose/Taking   albuterol (PROVENTIL HFA;VENTOLIN HFA) 108 (90 Base) MCG/ACT inhaler Inhale 1-2 puffs into the lungs every 6 (six) hours as needed for wheezing or shortness of breath.   Taking As Needed  desvenlafaxine (PRISTIQ) 50 MG 24 hr tablet Take 50 mg by mouth at bedtime.   Taking   diclofenac  (VOLTAREN ) 75 MG EC tablet TAKE 1 TABLET BY MOUTH 2 TIMES DAILY AS NEEDED. (Patient taking differently: Take 75 mg by mouth 2 (two) times daily.) 60 tablet 2 Taking Differently   ferrous sulfate 324 MG TBEC Take 324 mg by mouth in the morning.   Taking   No Known Allergies  Social History   Tobacco Use   Smoking status: Former    Current packs/day: 0.25    Types:  Cigarettes   Smokeless tobacco: Never  Substance Use Topics   Alcohol use: Yes    Alcohol/week: 7.0 standard drinks of alcohol    Types: 7 Cans of beer per week    Family History  Problem Relation Age of Onset   Hypertension Mother    Ovarian cancer Mother 79   Lung cancer Mother 36   Skin cancer Father        Melanoma   Breast cancer Paternal Grandmother        dx 74s, recurrence in 61s   Lung cancer Maternal Aunt        d. 28s   Leukemia Paternal Aunt    Skin cancer Maternal Grandmother      Review of Systems  Objective:  Physical Exam Vitals reviewed.  Constitutional:      Appearance: Normal appearance. She is normal weight.  HENT:     Head: Normocephalic and atraumatic.  Eyes:     Extraocular Movements: Extraocular movements intact.     Pupils: Pupils are equal, round, and reactive to light.  Cardiovascular:     Rate and Rhythm: Normal rate and regular rhythm.  Pulmonary:     Effort: Pulmonary effort is normal.     Breath sounds: Normal breath sounds.  Musculoskeletal:     Cervical back: Normal range of motion and neck supple.     Left hip: Tenderness and bony tenderness present. Decreased range of motion. Decreased strength.  Neurological:     Mental Status: She is alert and oriented to person, place, and time.  Psychiatric:        Behavior: Behavior normal.     Vital signs in last 24 hours:    Labs:   Estimated body mass index is 29.58 kg/m as calculated from the following:   Height as of 05/13/24: 5' 3 (1.6 m).   Weight as of 10/07/23: 75.8 kg.   Imaging Review Plain radiographs demonstrate severe degenerative joint disease of the left hip(s). The bone quality appears to be excellent for age and reported activity level.      Assessment/Plan:  End stage arthritis, left hip(s)  The patient history, physical examination, clinical judgement of the provider and imaging studies are consistent with end stage degenerative joint disease of the  left hip(s) and total hip arthroplasty is deemed medically necessary. The treatment options including medical management, injection therapy, arthroscopy and arthroplasty were discussed at length. The risks and benefits of total hip arthroplasty were presented and reviewed. The risks due to aseptic loosening, infection, stiffness, dislocation/subluxation,  thromboembolic complications and other imponderables were discussed.  The patient acknowledged the explanation, agreed to proceed with the plan and consent was signed. Patient is being admitted for inpatient treatment for surgery, pain control, PT, OT, prophylactic antibiotics, VTE prophylaxis, progressive ambulation and ADL's and discharge planning.The patient is planning to be discharged home with home health services

## 2024-05-22 ENCOUNTER — Observation Stay (HOSPITAL_COMMUNITY)

## 2024-05-22 ENCOUNTER — Other Ambulatory Visit: Payer: Self-pay

## 2024-05-22 ENCOUNTER — Ambulatory Visit (HOSPITAL_COMMUNITY)

## 2024-05-22 ENCOUNTER — Encounter (HOSPITAL_COMMUNITY)
Admission: RE | Disposition: A | Discharge status: Home / Self Care | Payer: Self-pay | Source: Home / Self Care | Attending: Orthopaedic Surgery

## 2024-05-22 ENCOUNTER — Ambulatory Visit (HOSPITAL_COMMUNITY): Payer: Self-pay | Admitting: Medical

## 2024-05-22 ENCOUNTER — Observation Stay (HOSPITAL_COMMUNITY)
Admission: RE | Admit: 2024-05-22 | Discharge: 2024-05-23 | Disposition: A | Discharge status: Home / Self Care | Attending: Orthopaedic Surgery | Admitting: Orthopaedic Surgery

## 2024-05-22 ENCOUNTER — Ambulatory Visit (HOSPITAL_BASED_OUTPATIENT_CLINIC_OR_DEPARTMENT_OTHER): Payer: Self-pay

## 2024-05-22 ENCOUNTER — Encounter (HOSPITAL_COMMUNITY): Payer: Self-pay | Admitting: Orthopaedic Surgery

## 2024-05-22 DIAGNOSIS — M1612 Unilateral primary osteoarthritis, left hip: Principal | ICD-10-CM | POA: Diagnosis present

## 2024-05-22 DIAGNOSIS — Z85828 Personal history of other malignant neoplasm of skin: Secondary | ICD-10-CM | POA: Diagnosis not present

## 2024-05-22 DIAGNOSIS — Z7982 Long term (current) use of aspirin: Secondary | ICD-10-CM | POA: Insufficient documentation

## 2024-05-22 DIAGNOSIS — Z853 Personal history of malignant neoplasm of breast: Secondary | ICD-10-CM | POA: Insufficient documentation

## 2024-05-22 DIAGNOSIS — Z87891 Personal history of nicotine dependence: Secondary | ICD-10-CM | POA: Insufficient documentation

## 2024-05-22 DIAGNOSIS — Z96642 Presence of left artificial hip joint: Secondary | ICD-10-CM

## 2024-05-22 DIAGNOSIS — J45909 Unspecified asthma, uncomplicated: Secondary | ICD-10-CM | POA: Diagnosis not present

## 2024-05-22 DIAGNOSIS — Z96641 Presence of right artificial hip joint: Secondary | ICD-10-CM | POA: Insufficient documentation

## 2024-05-22 DIAGNOSIS — M25552 Pain in left hip: Secondary | ICD-10-CM | POA: Diagnosis present

## 2024-05-22 HISTORY — PX: TOTAL HIP ARTHROPLASTY: SHX124

## 2024-05-22 LAB — TYPE AND SCREEN
ABO/RH(D): A POS
Antibody Screen: NEGATIVE

## 2024-05-22 SURGERY — ARTHROPLASTY, HIP, TOTAL, ANTERIOR APPROACH
Anesthesia: Spinal | Site: Hip | Laterality: Left

## 2024-05-22 MED ORDER — METOCLOPRAMIDE HCL 5 MG/ML IJ SOLN: 5.0000 mg | Freq: Three times a day (TID) | INTRAMUSCULAR | Status: DC | PRN

## 2024-05-22 MED ORDER — ONDANSETRON HCL 4 MG/2ML IJ SOLN
INTRAMUSCULAR | Status: DC | PRN
  Administered 2024-05-22: 4 mg via INTRAVENOUS

## 2024-05-22 MED ORDER — METHOCARBAMOL 500 MG PO TABS
500.0000 mg | ORAL_TABLET | Freq: Four times a day (QID) | ORAL | Status: DC | PRN
  Administered 2024-05-22 – 2024-05-23 (×4): 500 mg via ORAL
  Filled 2024-05-22 (×4): qty 1

## 2024-05-22 MED ORDER — MIDAZOLAM HCL 5 MG/5ML IJ SOLN
INTRAMUSCULAR | Status: DC | PRN
  Administered 2024-05-22: 2 mg via INTRAVENOUS

## 2024-05-22 MED ORDER — SODIUM CHLORIDE 0.9 % IR SOLN
Status: DC | PRN
  Administered 2024-05-22: 1000 mL

## 2024-05-22 MED ORDER — PANTOPRAZOLE SODIUM 40 MG PO TBEC
40.0000 mg | DELAYED_RELEASE_TABLET | Freq: Every day | ORAL | Status: DC
  Administered 2024-05-22 – 2024-05-23 (×2): 40 mg via ORAL
  Filled 2024-05-22 (×2): qty 1

## 2024-05-22 MED ORDER — METOCLOPRAMIDE HCL 5 MG PO TABS: 5.0000 mg | ORAL_TABLET | Freq: Three times a day (TID) | ORAL | Status: DC | PRN

## 2024-05-22 MED ORDER — ALBUTEROL SULFATE (2.5 MG/3ML) 0.083% IN NEBU: 3.0000 mL | INHALATION_SOLUTION | Freq: Four times a day (QID) | RESPIRATORY_TRACT | Status: DC | PRN

## 2024-05-22 MED ORDER — BUPIVACAINE IN DEXTROSE 0.75-8.25 % IT SOLN
INTRATHECAL | Status: DC | PRN
  Administered 2024-05-22: 1.8 mL via INTRATHECAL

## 2024-05-22 MED ORDER — HYDROMORPHONE HCL 1 MG/ML IJ SOLN
0.5000 mg | INTRAMUSCULAR | Status: DC | PRN
  Administered 2024-05-22 – 2024-05-23 (×5): 1 mg via INTRAVENOUS
  Filled 2024-05-22 (×5): qty 1

## 2024-05-22 MED ORDER — VENLAFAXINE HCL ER 75 MG PO CP24
75.0000 mg | ORAL_CAPSULE | Freq: Every day | ORAL | Status: DC
  Administered 2024-05-23: 75 mg via ORAL
  Filled 2024-05-22: qty 1

## 2024-05-22 MED ORDER — PHENOL 1.4 % MT LIQD: 1.0000 | OROMUCOSAL | Status: DC | PRN

## 2024-05-22 MED ORDER — ONDANSETRON HCL 4 MG/2ML IJ SOLN
INTRAMUSCULAR | Status: AC
  Filled 2024-05-22: qty 2

## 2024-05-22 MED ORDER — CEFAZOLIN SODIUM-DEXTROSE 2-4 GM/100ML-% IV SOLN
2.0000 g | INTRAVENOUS | Status: AC
  Administered 2024-05-22: 2 g via INTRAVENOUS
  Filled 2024-05-22: qty 100

## 2024-05-22 MED ORDER — DEXAMETHASONE SOD PHOSPHATE PF 10 MG/ML IJ SOLN
INTRAMUSCULAR | Status: DC | PRN
  Administered 2024-05-22: 8 mg via INTRAVENOUS

## 2024-05-22 MED ORDER — CEFAZOLIN SODIUM-DEXTROSE 2-4 GM/100ML-% IV SOLN
2.0000 g | Freq: Four times a day (QID) | INTRAVENOUS | Status: AC
  Administered 2024-05-22 (×2): 2 g via INTRAVENOUS
  Filled 2024-05-22 (×2): qty 100

## 2024-05-22 MED ORDER — CHLORHEXIDINE GLUCONATE 0.12 % MT SOLN
15.0000 mL | Freq: Once | OROMUCOSAL | Status: AC
  Administered 2024-05-22: 15 mL via OROMUCOSAL

## 2024-05-22 MED ORDER — DROPERIDOL 2.5 MG/ML IJ SOLN: 0.6250 mg | Freq: Once | INTRAMUSCULAR | Status: DC | PRN

## 2024-05-22 MED ORDER — LACTATED RINGERS IV SOLN: INTRAVENOUS | Status: DC

## 2024-05-22 MED ORDER — METHOCARBAMOL 1000 MG/10ML IJ SOLN: 500.0000 mg | Freq: Four times a day (QID) | INTRAMUSCULAR | Status: DC | PRN

## 2024-05-22 MED ORDER — ALBUTEROL SULFATE HFA 108 (90 BASE) MCG/ACT IN AERS
INHALATION_SPRAY | RESPIRATORY_TRACT | Status: DC | PRN
  Administered 2024-05-22: 2 via RESPIRATORY_TRACT

## 2024-05-22 MED ORDER — TRANEXAMIC ACID-NACL 1000-0.7 MG/100ML-% IV SOLN
1000.0000 mg | INTRAVENOUS | Status: AC
  Administered 2024-05-22: 1000 mg via INTRAVENOUS
  Filled 2024-05-22: qty 100

## 2024-05-22 MED ORDER — ONDANSETRON HCL 4 MG/2ML IJ SOLN: 4.0000 mg | Freq: Four times a day (QID) | INTRAMUSCULAR | Status: DC | PRN

## 2024-05-22 MED ORDER — ACETAMINOPHEN 500 MG PO TABS
1000.0000 mg | ORAL_TABLET | Freq: Once | ORAL | Status: AC
  Administered 2024-05-22: 1000 mg via ORAL
  Filled 2024-05-22: qty 2

## 2024-05-22 MED ORDER — MENTHOL 3 MG MT LOZG: 1.0000 | LOZENGE | OROMUCOSAL | Status: DC | PRN

## 2024-05-22 MED ORDER — OXYCODONE HCL 5 MG/5ML PO SOLN: 5.0000 mg | Freq: Once | ORAL | Status: DC | PRN

## 2024-05-22 MED ORDER — OXYCODONE HCL 5 MG PO TABS
10.0000 mg | ORAL_TABLET | ORAL | Status: DC | PRN
  Administered 2024-05-22: 15 mg via ORAL
  Administered 2024-05-22: 10 mg via ORAL
  Administered 2024-05-23 (×2): 15 mg via ORAL
  Filled 2024-05-22: qty 2
  Filled 2024-05-22 (×3): qty 3

## 2024-05-22 MED ORDER — OXYCODONE HCL 5 MG PO TABS
5.0000 mg | ORAL_TABLET | ORAL | Status: DC | PRN
  Administered 2024-05-22 (×3): 5 mg via ORAL
  Administered 2024-05-23 (×2): 10 mg via ORAL
  Filled 2024-05-22: qty 1
  Filled 2024-05-22: qty 2
  Filled 2024-05-22 (×2): qty 1
  Filled 2024-05-22: qty 2

## 2024-05-22 MED ORDER — ALBUTEROL SULFATE HFA 108 (90 BASE) MCG/ACT IN AERS
INHALATION_SPRAY | RESPIRATORY_TRACT | Status: AC
  Filled 2024-05-22: qty 6.7

## 2024-05-22 MED ORDER — POVIDONE-IODINE 10 % EX SWAB: 2.0000 | Freq: Once | CUTANEOUS | Status: DC

## 2024-05-22 MED ORDER — ORAL CARE MOUTH RINSE: 15.0000 mL | Freq: Once | OROMUCOSAL | Status: AC

## 2024-05-22 MED ORDER — OXYCODONE HCL 5 MG PO TABS: 5.0000 mg | ORAL_TABLET | Freq: Once | ORAL | Status: DC | PRN

## 2024-05-22 MED ORDER — FERROUS SULFATE 325 (65 FE) MG PO TABS
324.0000 mg | ORAL_TABLET | Freq: Every morning | ORAL | Status: DC
  Administered 2024-05-22 – 2024-05-23 (×2): 324 mg via ORAL
  Filled 2024-05-22 (×2): qty 1

## 2024-05-22 MED ORDER — PROPOFOL 500 MG/50ML IV EMUL
INTRAVENOUS | Status: DC | PRN
  Administered 2024-05-22: 40 mg via INTRAVENOUS
  Administered 2024-05-22: 120 ug/kg/min via INTRAVENOUS

## 2024-05-22 MED ORDER — ASPIRIN 81 MG PO CHEW
81.0000 mg | CHEWABLE_TABLET | Freq: Two times a day (BID) | ORAL | Status: DC
  Administered 2024-05-22 – 2024-05-23 (×2): 81 mg via ORAL
  Filled 2024-05-22 (×2): qty 1

## 2024-05-22 MED ORDER — SODIUM CHLORIDE 0.9 % IV SOLN: INTRAVENOUS | Status: DC

## 2024-05-22 MED ORDER — STERILE WATER FOR IRRIGATION IR SOLN
Status: DC | PRN
  Administered 2024-05-22: 2000 mL

## 2024-05-22 MED ORDER — ACETAMINOPHEN 10 MG/ML IV SOLN: 1000.0000 mg | Freq: Once | INTRAVENOUS | Status: DC | PRN

## 2024-05-22 MED ORDER — DIPHENHYDRAMINE HCL 12.5 MG/5ML PO ELIX: 12.5000 mg | ORAL_SOLUTION | ORAL | Status: DC | PRN

## 2024-05-22 MED ORDER — 0.9 % SODIUM CHLORIDE (POUR BTL) OPTIME
TOPICAL | Status: DC | PRN
  Administered 2024-05-22: 1000 mL

## 2024-05-22 MED ORDER — ONDANSETRON HCL 4 MG PO TABS
4.0000 mg | ORAL_TABLET | Freq: Four times a day (QID) | ORAL | Status: DC | PRN
  Administered 2024-05-23: 4 mg via ORAL
  Filled 2024-05-22: qty 1

## 2024-05-22 MED ORDER — ALUM & MAG HYDROXIDE-SIMETH 200-200-20 MG/5ML PO SUSP: 30.0000 mL | ORAL | Status: DC | PRN

## 2024-05-22 MED ORDER — DOCUSATE SODIUM 100 MG PO CAPS
100.0000 mg | ORAL_CAPSULE | Freq: Two times a day (BID) | ORAL | Status: DC
  Administered 2024-05-22 – 2024-05-23 (×3): 100 mg via ORAL
  Filled 2024-05-22 (×3): qty 1

## 2024-05-22 MED ORDER — FENTANYL CITRATE (PF) 50 MCG/ML IJ SOSY
PREFILLED_SYRINGE | INTRAMUSCULAR | Status: AC
  Filled 2024-05-22: qty 2

## 2024-05-22 MED ORDER — ACETAMINOPHEN 325 MG PO TABS: 325.0000 mg | ORAL_TABLET | Freq: Four times a day (QID) | ORAL | Status: DC | PRN

## 2024-05-22 MED ORDER — PROPOFOL 1000 MG/100ML IV EMUL
INTRAVENOUS | Status: AC
  Filled 2024-05-22: qty 100

## 2024-05-22 MED ORDER — MIDAZOLAM HCL 2 MG/2ML IJ SOLN
INTRAMUSCULAR | Status: AC
  Filled 2024-05-22: qty 2

## 2024-05-22 MED ORDER — FENTANYL CITRATE (PF) 50 MCG/ML IJ SOSY
25.0000 ug | PREFILLED_SYRINGE | INTRAMUSCULAR | Status: DC | PRN
  Administered 2024-05-22: 50 ug via INTRAVENOUS

## 2024-05-22 SURGICAL SUPPLY — 37 items
BAG COUNTER SPONGE SURGICOUNT (BAG) ×2 IMPLANT
BAG ZIPLOCK 12X15 (MISCELLANEOUS) ×2 IMPLANT
BALL HIP CERAMIC (Hips) IMPLANT
BENZOIN TINCTURE PRP APPL 2/3 (GAUZE/BANDAGES/DRESSINGS) IMPLANT
BLADE SAW SGTL 18X1.27X75 (BLADE) ×2 IMPLANT
COVER PERINEAL POST (MISCELLANEOUS) ×2 IMPLANT
COVER SURGICAL LIGHT HANDLE (MISCELLANEOUS) ×2 IMPLANT
CUP SECTOR GRIPTON 50MM (Cup) IMPLANT
DRAPE FOOT SWITCH (DRAPES) ×2 IMPLANT
DRAPE STERI IOBAN 125X83 (DRAPES) ×2 IMPLANT
DRAPE U-SHAPE 47X51 STRL (DRAPES) ×4 IMPLANT
DRESSING AQUACEL AG SP 3.5X10 (GAUZE/BANDAGES/DRESSINGS) IMPLANT
DRSG AQUACEL AG ADV 3.5X10 (GAUZE/BANDAGES/DRESSINGS) ×2 IMPLANT
DURAPREP 26ML APPLICATOR (WOUND CARE) ×2 IMPLANT
ELECT PENCIL ROCKER SW 15FT (MISCELLANEOUS) ×2 IMPLANT
ELECT REM PT RETURN 15FT ADLT (MISCELLANEOUS) ×2 IMPLANT
GAUZE XEROFORM 1X8 LF (GAUZE/BANDAGES/DRESSINGS) IMPLANT
GLOVE BIO SURGEON STRL SZ7.5 (GLOVE) ×2 IMPLANT
GLOVE BIOGEL PI IND STRL 8 (GLOVE) ×4 IMPLANT
GLOVE SURG ORTHO 8.0 STRL STRW (GLOVE) ×2 IMPLANT
GOWN STRL REUS W/ TWL XL LVL3 (GOWN DISPOSABLE) ×4 IMPLANT
HOLDER FOLEY CATH W/STRAP (MISCELLANEOUS) ×2 IMPLANT
KIT TURNOVER KIT A (KITS) ×2 IMPLANT
LINER ACET PNNCL PLUS4 NEUTRAL (Hips) IMPLANT
PACK ANTERIOR HIP CUSTOM (KITS) ×2 IMPLANT
SET HNDPC FAN SPRY TIP SCT (DISPOSABLE) ×2 IMPLANT
STAPLER SKIN PROX 35W (STAPLE) IMPLANT
STEM FEM ACTIS HIGH SZ2 (Stem) IMPLANT
STRIP CLOSURE SKIN 1/2X4 (GAUZE/BANDAGES/DRESSINGS) IMPLANT
SUT ETHIBOND NAB CT1 #1 30IN (SUTURE) ×2 IMPLANT
SUT ETHILON 2 0 PS N (SUTURE) IMPLANT
SUT MNCRL AB 4-0 PS2 18 (SUTURE) IMPLANT
SUT VIC AB 0 CT1 36 (SUTURE) ×2 IMPLANT
SUT VIC AB 1 CT1 36 (SUTURE) ×2 IMPLANT
SUT VIC AB 2-0 CT1 TAPERPNT 27 (SUTURE) ×4 IMPLANT
TRAY FOLEY MTR SLVR 16FR STAT (SET/KITS/TRAYS/PACK) ×2 IMPLANT
YANKAUER SUCT BULB TIP NO VENT (SUCTIONS) ×2 IMPLANT

## 2024-05-22 NOTE — Plan of Care (Signed)

## 2024-05-22 NOTE — Transfer of Care (Signed)
 Immediate Anesthesia Transfer of Care Note  Patient: Ann Patton  Procedure(s) Performed: ARTHROPLASTY, HIP, TOTAL, ANTERIOR APPROACH (Left: Hip)  Patient Location: PACU  Anesthesia Type:Spinal  Level of Consciousness: awake  Airway & Oxygen Therapy: Patient Spontanous Breathing and Patient connected to face mask oxygen  Post-op Assessment: Report given to RN and Post -op Vital signs reviewed and stable  Post vital signs: Reviewed and stable  Last Vitals:  Vitals Value Taken Time  BP 97/63 05/22/24 08:48  Temp    Pulse 77 05/22/24 08:49  Resp 14 05/22/24 08:49  SpO2 100 % 05/22/24 08:49  Vitals shown include unfiled device data.  Last Pain:  Vitals:   05/22/24 0614  TempSrc: Oral  PainSc:          Complications: No notable events documented.

## 2024-05-22 NOTE — Op Note (Signed)
 Operative Note  Date of operation: 05/22/2024 Preoperative diagnosis: Left hip primary osteoarthritis Postoperative diagnosis: Same  Procedure: Left direct anterior total hip arthroplasty  Implants: Implant Name Type Inv. Item Serial No. Manufacturer Lot No. LRB No. Used Action  CUP SECTOR GRIPTON - [id number] Cup CUP SECTOR GRIPTON  [employer] [id number] Left 1 Implanted  LINER ACET PNNCL PLUS4 NEUTRAL - [id number] Hips LINER ACET PNNCL PLUS4 NEUTRAL  DEPUY ORTHOPAEDICS [id number] Left 1 Implanted  STEM FEM ACTIS HIGH SZ2 - [id number] Stem STEM FEM ACTIS HIGH SZ2  DEPUY ORTHOPAED[employer] [id number] Left 1 Implanted  BALL HIP CERAMIC - [id number] Hips BALL HIP CERAMIC  DEPUY ORTHOPAED[employer] [id number] Left 1 Implanted   Surgeon: Lonni GRADE. Vernetta, MD Assistant: Tory Gaskins, PA-C  Anesthesia: Spinal Antibiotics: IV Ancef  EBL: 150 cc Complications: None  Indications: The patient is a very pleasant 53 year old female well-known to us .  She has debilitating arthritis involving her left hip this been well-documented with clinical exam and x-ray findings.  We actually placed her right hip in December 2024 and that has done very well.  At this point her left hip pain is daily and it is detrimentally affecting her mobility, her quality of life and her actives a day living to the point she does wish to proceed with hip replacement left side.  Having had this before and the right side she is fully aware of the risks of acute blood loss anemia, nerve and vessel injury, fracture, infection, DVT, dislocation, implant failure, leg length differences and wound healing issues.  She understands that our goals are hopefully decreased pain, improved mobility and improve quality of life.  Procedure description: After informed consent was obtained and the appropriate left hip was marked, the patient was brought to the operating room and set up on the stretcher where spinal anesthesia was obtained.  She was  then laid in a supine position on the stretcher and a Foley catheter was placed.  Next traction boots were placed on both feet and then she was placed supine on the Hana fracture table with a perineal post and placed in both legs in inline skeletal traction devices with no traction applied.  Her left operative hip and pelvis were assessed radiographically.  The left hip was prepped and draped with DuraPrep and sterile drapes.  A timeout was called and she was identified as the correct patient and the correct left hip.  An incision was then made just inferior and posterior to the ASIS and carried slightly obliquely down the leg.  Dissection was carried down to the tensor fascia lata muscle and the tensor fascia was divided longitudinally to proceed with a direct anterior approach the hip.  Circumflex vessels were identified and cauterized.  The hip capsule was identified and opened up in L-type format finding a moderate joint effusion.  Cobra retractors were then placed around the medial and lateral femoral neck and a femoral neck cut was made with an oscillating saw just proximal to the lesser trochanter and this was completed with an osteotome.  A corkscrew guide was placed in the femoral head and the femoral head and the femoral head was removed in its entirety and there was a wide area devoid of cartilage and significant flattening of the head.  A bent Hohmann was then placed over the medial acetabular rim and remanence of the acetabular labrum and other debris removed.  Reaming was then initiated from a size 43 reamer and stepwise increments going up to a size  49 reamer with all reamers placed under direct visualization and the last reamer also placed under direct fluoroscopy in order to obtain the depth of the reaming, the inclination and the anteversion.  The real DePuy sector GRIPTION acetabular component size 50 was then placed without difficulty followed by a 32+4 polythene liner based on offset.  Attention  was then turned to the femur.  With the left leg externally rotated to 120 degrees, extended and adducted, a Mueller retractor was placed medially and a Hohmann retractor about the greater trochanter.  The lateral joint capsule was released and a box cutting osteotome was used into the femoral canal.  Broaching was then initiated using the Actis broaching system from a size 0 going up to a size 2.  With a size 2 in place we trialed a high offset femoral neck and a 32+1 trial head ball.  The left leg was brought over and up and with traction and into rotation reduced in the pelvis.  Based on clinical and radiographic assessment we felt like we needed just a little more leg length.  We dislocated the hip and remove the trial components.  We then placed the real Actis femoral component with high offset size 2 and the real 32+5 ceramic head ball.  Again this was reduced in the pelvis and we are pleased with leg length, offset, range of motion and stability which stability is most important.  The soft tissue was then irrigated with normal saline solution.  Again we assessed the hip radiographically.  The joint capsule was then closed with interrupted #1 Ethibond suture followed by normal Vicryl to close the tensor fascia.  0 Vicryl was used to close deep tissue and 2-0 Vicryl was used to close subcutaneous tissue.  The skin was closed with staples.  An Aquacel dressing was applied.  The patient was next taken off the Hana table and taken the recovery room.  Tory Gaskins, PA-C did assist during the entire case and beginning to end and his assistance was crucial and medically necessary for soft tissue management and retraction, helping guide implant placement and a layered closure of the wound.

## 2024-05-22 NOTE — Evaluation (Signed)
 Physical Therapy Evaluation Patient Details Name: Ann Patton MRN: [id number] DOB: [birth year] Today's Date: 05/22/2024  History of Present Illness  Pt s/p L THR and with hx of R THR and breast CA  Clinical Impression  Pt s/p R THR and presents with decreased R LE strength/ROM and post op pain limiting functional mobility.  Pt should progress well to dc home with family assist.        If plan is discharge home, recommend the following: A little help with walking and/or transfers;A little help with bathing/dressing/bathroom;Assistance with cooking/housework;Assist for transportation;Help with stairs or ramp for entrance   Can travel by private vehicle        Equipment Recommendations None recommended by PT  Recommendations for Other Services       Functional Status Assessment Patient has had a recent decline in their functional status and demonstrates the ability to make significant improvements in function in a reasonable and predictable amount of time.     Precautions / Restrictions Precautions Precautions: Fall Restrictions Weight Bearing Restrictions Per Provider Order: Yes LLE Weight Bearing Per Provider Order: Weight bearing as tolerated      Mobility  Bed Mobility Overal bed mobility: Needs Assistance Bed Mobility: Supine to Sit     Supine to sit: Min assist     General bed mobility comments: Increased time with cues for sequence and use of R LE to self assist    Transfers Overall transfer level: Needs assistance Equipment used: Rolling walker (2 wheels) Transfers: Sit to/from Stand, Bed to chair/wheelchair/BSC Sit to Stand: Min assist   Step pivot transfers: Min assist       General transfer comment: cues for LE management and use of UEs to self assist;  STep pvt with RW bed to recliner    Ambulation/Gait Ambulation/Gait assistance: Min assist Gait Distance (Feet): 26 Feet Assistive device: Rolling walker (2 wheels) Gait  Pattern/deviations: Step-to pattern, Decreased step length - right, Decreased stance time - right, Shuffle, Trunk flexed Gait velocity: decr     General Gait Details: cues for sequence, posture and position from [employer]            Wheelchair Mobility     Tilt Bed    Modified Rankin (Stroke Patients Only)       Balance Overall balance assessment: Needs assistance Sitting-balance support: No upper extremity supported, Feet supported Sitting balance-Leahy Scale: Good     Standing balance support: Bilateral upper extremity supported Standing balance-Leahy Scale: Poor                               Pertinent Vitals/Pain Pain Assessment Pain Assessment: 0-10 Pain Score: 6  Pain Location: L hip Pain Descriptors / Indicators: Aching, Sore Pain Intervention(s): Limited activity within patient's tolerance, Monitored during session, Premedicated before session, Ice applied    Home Living Family/patient expects to be discharged to:: Private residence Living Arrangements: Spouse/significant other Available Help at Discharge: Family Type of Home: House Home Access: Level entry     Alternate Level Stairs-Number of Steps: 12 Home Layout: Two level Home Equipment: Agricultural Consultant (2 wheels);Cane - single point      Prior Function Prior Level of Function : Independent/Modified Independent                     Extremity/Trunk Assessment   Upper Extremity Assessment Upper Extremity Assessment: Overall WFL for tasks assessed  Lower Extremity Assessment Lower Extremity Assessment: LLE deficits/detail    Cervical / Trunk Assessment Cervical / Trunk Assessment: Normal  Communication   Communication Communication: No apparent difficulties    Cognition Arousal: Alert Behavior During Therapy: WFL for tasks assessed/performed   PT - Cognitive impairments: No apparent impairments                         Following commands: Intact        Cueing Cueing Techniques: Verbal cues     General Comments      Exercises Total Joint Exercises Ankle Circles/Pumps: AROM, Both, 15 reps, Supine   Assessment/Plan    PT Assessment Patient needs continued PT services  PT Problem List Decreased range of motion;Decreased strength;Decreased activity tolerance;Decreased balance;Decreased mobility;Pain;Decreased knowledge of use of DME       PT Treatment Interventions DME instruction;Gait training;Stair training;Functional mobility training;Therapeutic activities;Therapeutic exercise;Patient/family education    PT Goals (Current goals can be found in the Care Plan section)  Acute Rehab PT Goals Patient Stated Goal: Regain IND PT Goal Formulation: With patient Time For Goal Achievement: 05/29/24 Potential to Achieve Goals: Good    Frequency 7X/week     Co-evaluation               AM-PAC PT 6 Clicks Mobility  Outcome Measure Help needed turning from your back to your side while in a flat bed without using bedrails?: A Little Help needed moving from lying on your back to sitting on the side of a flat bed without using bedrails?: A Little Help needed moving to and from a bed to a chair (including a wheelchair)?: A Little Help needed standing up from a chair using your arms (e.g., wheelchair or bedside chair)?: A Little Help needed to walk in hospital room?: A Little Help needed climbing 3-5 steps with a railing? : A Lot 6 Click Score: 17    End of Session Equipment Utilized During Treatment: Gait belt Activity Tolerance: Patient tolerated treatment well Patient left: in chair;with call bell/phone within reach;with chair alarm set Nurse Communication: Mobility status PT Visit Diagnosis: Difficulty in walking, not elsewhere classified (R26.2)    Time: 8467-8441 PT Time Calculation (min) (ACUTE ONLY): 26 min   Charges:   PT Evaluation $PT Eval Low Complexity: 1 Low PT Treatments $Gait Training: 8-22 mins PT  General Charges $$ ACUTE PT VISIT: 1 Visit         [facility] PT Acute Rehabilitation Services Office [phone]   Paz Winsett 05/22/2024, 5:28 PM

## 2024-05-22 NOTE — Anesthesia Postprocedure Evaluation (Signed)
 Anesthesia Post Note  Patient: Ann Patton  Procedure(s) Performed: ARTHROPLASTY, HIP, TOTAL, ANTERIOR APPROACH (Left: Hip)     Patient location during evaluation: PACU Anesthesia Type: Spinal Level of consciousness: oriented and awake and alert Pain management: pain level controlled Vital Signs Assessment: post-procedure vital signs reviewed and stable Respiratory status: spontaneous breathing, respiratory function stable and patient connected to nasal cannula oxygen Cardiovascular status: blood pressure returned to baseline and stable Postop Assessment: no headache, no backache and no apparent nausea or vomiting Anesthetic complications: no   No notable events documented.  Last Vitals:  Vitals:   05/22/24 1015 05/22/24 1040  BP: (!) 112/58 115/76  Pulse: (!) 54 67  Resp: 10 12  Temp:    SpO2: 100% 100%    Last Pain:  Vitals:   05/22/24 1105  TempSrc:   PainSc: 6                  Thom JONELLE Peoples

## 2024-05-22 NOTE — Anesthesia Procedure Notes (Signed)
 Spinal  Patient location during procedure: OR Start time: 05/22/2024 7:15 AM End time: 05/22/2024 7:20 AM Reason for block: surgical anesthesia Staffing Performed: anesthesiologist  Anesthesiologist: Erma Thom SAUNDERS, MD Performed by: Erma Thom SAUNDERS, MD Authorized by: Erma Thom SAUNDERS, MD   Preanesthetic Checklist Completed: patient identified, IV checked, site marked, risks and benefits discussed, surgical consent, monitors and equipment checked, pre-op evaluation and timeout performed Spinal Block Patient position: sitting Prep: DuraPrep Patient monitoring: heart rate, cardiac monitor, continuous pulse ox and blood pressure Approach: midline Location: L4-5 Needle Needle type: Pencan  Needle gauge: 24 G Assessment Sensory level: T6 Additional Notes Functioning IV was confirmed and monitors were applied. Sterile prep and drape, including hand hygiene and sterile gloves were used. The patient was positioned and the spine was prepped. The skin was anesthetized with lidocaine .  Free flow of clear CSF was obtained prior to injecting local anesthetic into the CSF.  The spinal needle aspirated freely following injection.  The needle was carefully withdrawn.  The patient tolerated the procedure well.

## 2024-05-22 NOTE — Interval H&P Note (Signed)
 History and Physical Interval Note: The patient understands that she is here today for a left total hip replacement to treat her significant left hip pain and arthritis.  There has been no acute or interval change in her medical status.  The risks and benefits of surgery have been discussed in detail and informed consent has been obtained.  The left operative hip has been marked.  05/22/2024 6:52 AM  Ann Patton  has presented today for surgery, with the diagnosis of Osteoarthritis Left Hip.  The various methods of treatment have been discussed with the patient and family. After consideration of risks, benefits and other options for treatment, the patient has consented to  Procedure(s): ARTHROPLASTY, HIP, TOTAL, ANTERIOR APPROACH (Left) as a surgical intervention.  The patient's history has been reviewed, patient examined, no change in status, stable for surgery.  I have reviewed the patient's chart and labs.  Questions were answered to the patient's satisfaction.     [name]

## 2024-05-23 DIAGNOSIS — M1612 Unilateral primary osteoarthritis, left hip: Secondary | ICD-10-CM | POA: Diagnosis not present

## 2024-05-23 LAB — BASIC METABOLIC PANEL WITH GFR
Anion gap: 9 (ref 5–15)
BUN: 10 mg/dL (ref 6–20)
CO2: 25 mmol/L (ref 22–32)
Calcium: 8.4 mg/dL — ABNORMAL LOW (ref 8.9–10.3)
Chloride: 102 mmol/L (ref 98–111)
Creatinine, Ser: 0.79 mg/dL (ref 0.44–1.00)
GFR, Estimated: >60 mL/min (ref >60)
Glucose, Bld: 122 mg/dL — ABNORMAL HIGH (ref 70–99)
Potassium: 4.2 mmol/L (ref 3.5–5.1)
Sodium: 136 mmol/L (ref 135–145)

## 2024-05-23 LAB — CBC
HCT: 28.1 % — ABNORMAL LOW (ref 36.0–46.0)
Hemoglobin: 8.8 g/dL — ABNORMAL LOW (ref 12.0–15.0)
MCH: 28.3 pg (ref 26.0–34.0)
MCHC: 31.3 g/dL (ref 30.0–36.0)
MCV: 90.4 fL (ref 80.0–100.0)
Platelets: 308 K/uL (ref 150–400)
RBC: 3.11 MIL/uL — ABNORMAL LOW (ref 3.87–5.11)
RDW: 14.4 % (ref 11.5–15.5)
WBC: 7.3 K/uL (ref 4.0–10.5)
nRBC: 0 % (ref 0.0–0.2)

## 2024-05-23 MED ORDER — GABAPENTIN 100 MG PO CAPS: 100.0000 mg | ORAL_CAPSULE | Freq: Three times a day (TID) | ORAL | 0 refills | Status: DC | PRN

## 2024-05-23 MED ORDER — ASPIRIN 81 MG PO CHEW: 81.0000 mg | CHEWABLE_TABLET | Freq: Two times a day (BID) | ORAL | 0 refills | Status: AC

## 2024-05-23 MED ORDER — TIZANIDINE HCL 4 MG PO TABS: 4.0000 mg | ORAL_TABLET | Freq: Four times a day (QID) | ORAL | 1 refills | Status: DC | PRN

## 2024-05-23 MED ORDER — KETOROLAC TROMETHAMINE 15 MG/ML IJ SOLN
7.5000 mg | Freq: Once | INTRAMUSCULAR | Status: AC
  Administered 2024-05-23: 7.5 mg via INTRAVENOUS
  Filled 2024-05-23: qty 1

## 2024-05-23 MED ORDER — GABAPENTIN 100 MG PO CAPS
100.0000 mg | ORAL_CAPSULE | Freq: Three times a day (TID) | ORAL | Status: DC
  Administered 2024-05-23: 100 mg via ORAL
  Filled 2024-05-23: qty 1

## 2024-05-23 MED ORDER — OXYCODONE HCL 5 MG PO TABS: 5.0000 mg | ORAL_TABLET | ORAL | 0 refills | Status: DC | PRN

## 2024-05-23 MED ORDER — TIZANIDINE HCL 4 MG PO TABS: 4.0000 mg | ORAL_TABLET | Freq: Four times a day (QID) | ORAL | Status: DC | PRN

## 2024-05-23 NOTE — Discharge Instructions (Signed)
 Per [facility] clinic policy, our goal is ensure optimal postoperative pain control with a multimodal pain management strategy. For all [facility] patients, our goal is to wean post-operative narcotic medications by 6 weeks post-operatively. If this is not possible due to utilization of pain medication prior to surgery, your [facility] doctor will support your acute post-operative pain control for the first 6 weeks postoperatively, with a plan to transition you back to your primary pain team following that. Ann Patton will work to ensure a Therapist, occupational.  INSTRUCTIONS AFTER JOINT REPLACEMENT   Remove items at home which could result in a fall. This includes throw rugs or furniture in walking pathways ICE to the affected joint every three hours while awake for 30 minutes at a time, for at least the first 3-5 days, and then as needed for pain and swelling.  Continue to use ice for pain and swelling. You may notice swelling that will progress down to the foot and ankle.  This is normal after surgery.  Elevate your leg when you are not up walking on it.   Continue to use the breathing machine you got in the hospital (incentive spirometer) which will help keep your temperature down.  It is common for your temperature to cycle up and down following surgery, especially at night when you are not up moving around and exerting yourself.  The breathing machine keeps your lungs expanded and your temperature down.   DIET:  As you were doing prior to hospitalization, we recommend a well-balanced diet.  DRESSING / WOUND CARE / SHOWERING  Keep the surgical dressing until follow up.  The dressing is water  proof, so you can shower without any extra covering.  IF THE DRESSING FALLS OFF or the wound gets wet inside, change the dressing with sterile gauze.  Please use good hand washing techniques before changing the dressing.  Do not use any lotions or creams on the incision until instructed by your surgeon.     ACTIVITY  Increase activity slowly as tolerated, but follow the weight bearing instructions below.   No driving for 6 weeks or until further direction given by your physician.  You cannot drive while taking narcotics.  No lifting or carrying greater than 10 lbs. until further directed by your surgeon. Avoid periods of inactivity such as sitting longer than an hour when not asleep. This helps prevent blood clots.  You may return to work once you are authorized by your doctor.     WEIGHT BEARING   Weight bearing as tolerated with assist device (walker, cane, etc) as directed, use it as long as suggested by your surgeon or therapist, typically at least 4-6 weeks.   EXERCISES  Results after joint replacement surgery are often greatly improved when you follow the exercise, range of motion and muscle strengthening exercises prescribed by your doctor. Safety measures are also important to protect the joint from further injury. Any time any of these exercises cause you to have increased pain or swelling, decrease what you are doing until you are comfortable again and then slowly increase them. If you have problems or questions, call your caregiver or physical therapist for advice.   Rehabilitation is important following a joint replacement. After just a few days of immobilization, the muscles of the leg can become weakened and shrink (atrophy).  These exercises are designed to build up the tone and strength of the thigh and leg muscles and to improve motion. Often times heat used for twenty to thirty minutes before  working out will loosen up your tissues and help with improving the range of motion but do not use heat for the first two weeks following surgery (sometimes heat can increase post-operative swelling).   These exercises can be done on a training (exercise) mat, on the floor, on a table or on a bed. Use whatever works the best and is most comfortable for you.    Use music or television  while you are exercising so that the exercises are a pleasant break in your day. This will make your life better with the exercises acting as a break in your routine that you can look forward to.   Perform all exercises about fifteen times, three times per day or as directed.  You should exercise both the operative leg and the other leg as well.  Exercises include:   Quad Sets - Tighten up the muscle on the front of the thigh (Quad) and hold for 5-10 seconds.   Straight Leg Raises - With your knee straight (if you were given a brace, keep it on), lift the leg to 60 degrees, hold for 3 seconds, and slowly lower the leg.  Perform this exercise against resistance later as your leg gets stronger.  Leg Slides: Lying on your back, slowly slide your foot toward your buttocks, bending your knee up off the floor (only go as far as is comfortable). Then slowly slide your foot back down until your leg is flat on the floor again.  Angel Wings: Lying on your back spread your legs to the side as far apart as you can without causing discomfort.  Hamstring Strength:  Lying on your back, push your heel against the floor with your leg straight by tightening up the muscles of your buttocks.  Repeat, but this time bend your knee to a comfortable angle, and push your heel against the floor.  You may put a pillow under the heel to make it more comfortable if necessary.   A rehabilitation program following joint replacement surgery can speed recovery and prevent re-injury in the future due to weakened muscles. Contact your doctor or a physical therapist for more information on knee rehabilitation.    CONSTIPATION  Constipation is defined medically as fewer than three stools per week and severe constipation as less than one stool per week.  Even if you have a regular bowel pattern at home, your normal regimen is likely to be disrupted due to multiple reasons following surgery.  Combination of anesthesia, postoperative  narcotics, change in appetite and fluid intake all can affect your bowels.   YOU MUST use at least one of the following options; they are listed in order of increasing strength to get the job done.  They are all available over the counter, and you may need to use some, POSSIBLY even all of these options:    Drink plenty of fluids (prune juice may be helpful) and high fiber foods Colace 100 mg by mouth twice a day  Senokot for constipation as directed and as needed Dulcolax (bisacodyl), take with full glass of water  Miralax (polyethylene glycol) once or twice a day as needed.  If you have tried all these things and are unable to have a bowel movement in the first 3-4 days after surgery call either your surgeon or your primary doctor.    If you experience loose stools or diarrhea, hold the medications until you stool forms back up.  If your symptoms do not get better within 1 week  or if they get worse, check with your doctor.  If you experience the worst abdominal pain ever or develop nausea or vomiting, please contact the office immediately for further recommendations for treatment.   ITCHING:  If you experience itching with your medications, try taking only a single pain pill, or even half a pain pill at a time.  You can also use Benadryl  over the counter for itching or also to help with sleep.   TED HOSE STOCKINGS:  Use stockings on both legs until for at least 2 weeks or as directed by physician office. They may be removed at night for sleeping.  MEDICATIONS:  See your medication summary on the "After Visit Summary" that nursing will review with you.  You may have some home medications which will be placed on hold until you complete the course of blood thinner medication.  It is important for you to complete the blood thinner medication as prescribed.  PRECAUTIONS:  If you experience chest pain or shortness of breath - call 911 immediately for transfer to the hospital emergency department.    If you develop a fever greater that 101 F, purulent drainage from wound, increased redness or drainage from wound, foul odor from the wound/dressing, or calf pain - CONTACT YOUR SURGEON.                                                   FOLLOW-UP APPOINTMENTS:  If you do not already have a post-op appointment, please call the office for an appointment to be seen by your surgeon.  Guidelines for how soon to be seen are listed in your "After Visit Summary", but are typically between 1-4 weeks after surgery.  OTHER INSTRUCTIONS:   Knee Replacement:  Do not place pillow under knee, focus on keeping the knee straight while resting. CPM instructions: 0-90 degrees, 2 hours in the morning, 2 hours in the afternoon, and 2 hours in the evening. Place foam block, curve side up under heel at all times except when in CPM or when walking.  DO NOT modify, tear, cut, or change the foam block in any way.  POST-OPERATIVE OPIOID TAPER INSTRUCTIONS: It is important to wean off of your opioid medication as soon as possible. If you do not need pain medication after your surgery it is ok to stop day one. Opioids include: Codeine, Hydrocodone(Norco, Vicodin), Oxycodone (Percocet, oxycontin ) and hydromorphone  amongst others.  Long term and even short term use of opiods can cause: Increased pain response Dependence Constipation Depression Respiratory depression And more.  Withdrawal symptoms can include Flu like symptoms Nausea, vomiting And more Techniques to manage these symptoms Hydrate well Eat regular healthy meals Stay active Use relaxation techniques(deep breathing, meditating, yoga) Do Not substitute Alcohol to help with tapering If you have been on opioids for less than two weeks and do not have pain than it is ok to stop all together.  Plan to wean off of opioids This plan should start within one week post op of your joint replacement. Maintain the same interval or time between taking each dose  and first decrease the dose.  Cut the total daily intake of opioids by one tablet each day Next start to increase the time between doses. The last dose that should be eliminated is the evening dose.   MAKE SURE YOU:  Understand these instructions.  Get help right away if you are not doing well or get worse.    Thank you for letting us  be a part of your medical care team.  It is a privilege we respect greatly.  We hope these instructions will help you stay on track for a fast and full recovery!     Dental Antibiotics:  In most cases prophylactic antibiotics for Dental procdeures after total joint surgery are not necessary.  Exceptions are as follows:  1. History of prior total joint infection  2. Severely immunocompromised (Organ Transplant, cancer chemotherapy, Rheumatoid biologic meds such as Humera)  3. Poorly controlled diabetes (A1C &gt; 8.0, blood glucose over 200)  If you have one of these conditions, contact your surgeon for an antibiotic prescription, prior to your dental procedure.

## 2024-05-23 NOTE — Progress Notes (Signed)
 Subjective: 1 Day Post-Op Procedure(s) (LRB): ARTHROPLASTY, HIP, TOTAL, ANTERIOR APPROACH (Left) Patient reports pain as significant and somewhat worse than her last hip replacement.  She is experiencing more thigh pain and spasms.  She did have a rough evening.  Her hemoglobin this morning is 8.8 but she is chronically anemic and that is a small drop from her preoperative hemoglobin.  Her vital signs are stable.  Objective: Vital signs in last 24 hours: Temp:  [98 F (36.7 C)-99.2 F (37.3 C)] 99.2 F (37.3 C) (11/08 0550) Pulse Rate:  [67-91] 89 (11/08 0836) Resp:  [16-20] 17 (11/08 0550) BP: (105-124)/(56-71) 124/70 (11/08 0836) SpO2:  [97 %-100 %] 98 % (11/08 0550)  Intake/Output from previous day: 11/07 0701 - 11/08 0700 In: 2025.2 [P.O.:480; I.V.:1245.2; IV Piggyback:300] Out: 2250 [Urine:2100; Blood:150] Intake/Output this shift: No intake/output data recorded.  Recent Labs    05/23/24 0339  HGB 8.8*   Recent Labs    05/23/24 0339  WBC 7.3  RBC 3.11*  HCT 28.1*  PLT 308   Recent Labs    05/23/24 0339  NA 136  K 4.2  CL 102  CO2 25  BUN 10  CREATININE 0.79  GLUCOSE 122*  CALCIUM 8.4*   No results for input(s): LABPT, INR in the last 72 hours.  Sensation intact distally Intact pulses distally Dorsiflexion/Plantar flexion intact Incision: dressing C/D/I   Assessment/Plan: 1 Day Post-Op Procedure(s) (LRB): ARTHROPLASTY, HIP, TOTAL, ANTERIOR APPROACH (Left) Up with therapy Discharge home with home health this afternoon.      [name] 05/23/2024, 10:50 AM

## 2024-05-23 NOTE — Progress Notes (Signed)
 Physical Therapy Treatment Patient Details Name: Ann Patton MRN: [id number] DOB: 1971-01-17 Today's Date: 05/23/2024   History of Present Illness Pt s/p L THR and with hx of R THR and breast CA    PT Comments  Pt very cooperative and progressing with mobility including HEP initiated and up to ambulate increased distance in hall.  Pt hopeful for dc home later today.    If plan is discharge home, recommend the following: A little help with walking and/or transfers;A little help with bathing/dressing/bathroom;Assistance with cooking/housework;Assist for transportation;Help with stairs or ramp for entrance   Can travel by private vehicle        Equipment Recommendations  None recommended by PT    Recommendations for Other Services       Precautions / Restrictions Precautions Precautions: Fall Restrictions Weight Bearing Restrictions Per Provider Order: Yes LLE Weight Bearing Per Provider Order: Weight bearing as tolerated     Mobility  Bed Mobility Overal bed mobility: Needs Assistance Bed Mobility: Supine to Sit     Supine to sit: Min assist     General bed mobility comments: Increased time with cues for sequence and use of R LE to self assist    Transfers Overall transfer level: Needs assistance Equipment used: Rolling walker (2 wheels) Transfers: Sit to/from Stand Sit to Stand: Contact guard assist           General transfer comment: cues for LE management and use of UEs to self assist;    Ambulation/Gait Ambulation/Gait assistance: Contact guard assist Gait Distance (Feet): 44 Feet Assistive device: Rolling walker (2 wheels) Gait Pattern/deviations: Step-to pattern, Decreased step length - right, Decreased stance time - right, Shuffle, Trunk flexed Gait velocity: decr     General Gait Details: min cues for sequence, posture and position from [employer]             Wheelchair Mobility     Tilt Bed    Modified Rankin (Stroke  Patients Only)       Balance Overall balance assessment: Needs assistance Sitting-balance support: No upper extremity supported, Feet supported Sitting balance-Leahy Scale: Good     Standing balance support: Single extremity supported Standing balance-Leahy Scale: Poor                              Communication Communication Communication: No apparent difficulties  Cognition Arousal: Alert Behavior During Therapy: WFL for tasks assessed/performed   PT - Cognitive impairments: No apparent impairments                         Following commands: Intact      Cueing Cueing Techniques: Verbal cues  Exercises Total Joint Exercises Ankle Circles/Pumps: AROM, Both, 15 reps, Supine Quad Sets: AROM, Both, 10 reps, Supine Heel Slides: AAROM, Right, 20 reps, Supine Hip ABduction/ADduction: AAROM, Left, 15 reps, Supine    General Comments        Pertinent Vitals/Pain Pain Assessment Pain Assessment: 0-10 Pain Score: 4  Pain Location: L hip Pain Descriptors / Indicators: Aching, Sore Pain Intervention(s): Limited activity within patient's tolerance, Monitored during session, Premedicated before session, Ice applied    Home Living                          Prior Function            PT Goals (current  goals can now be found in the care plan section) Acute Rehab PT Goals Patient Stated Goal: Regain IND PT Goal Formulation: With patient Time For Goal Achievement: 05/29/24 Potential to Achieve Goals: Good Progress towards PT goals: Progressing toward goals    Frequency    7X/week      PT Plan      Co-evaluation              AM-PAC PT 6 Clicks Mobility   Outcome Measure  Help needed turning from your back to your side while in a flat bed without using bedrails?: A Little Help needed moving from lying on your back to sitting on the side of a flat bed without using bedrails?: A Little Help needed moving to and from a bed to  a chair (including a wheelchair)?: A Little Help needed standing up from a chair using your arms (e.g., wheelchair or bedside chair)?: A Little Help needed to walk in hospital room?: A Little Help needed climbing 3-5 steps with a railing? : A Lot 6 Click Score: 17    End of Session Equipment Utilized During Treatment: Gait belt Activity Tolerance: Patient tolerated treatment well Patient left: in chair;with call bell/phone within reach;with chair alarm set Nurse Communication: Mobility status PT Visit Diagnosis: Difficulty in walking, not elsewhere classified (R26.2)     Time: 9074-9051 PT Time Calculation (min) (ACUTE ONLY): 23 min  Charges:    $Gait Training: 8-22 mins $Therapeutic Exercise: 8-22 mins PT General Charges $$ ACUTE PT VISIT: 1 Visit                     [facility] PT Acute Rehabilitation Services Office [phone]    Myrick Mcnairy 05/23/2024, 1:24 PM

## 2024-05-23 NOTE — Progress Notes (Signed)
 Physical Therapy Treatment Patient Details Name: Ann Patton MRN: [id number] DOB: 1971-02-06 Today's Date: 05/23/2024   History of Present Illness Pt s/p L THR and with hx of R THR and breast CA    PT Comments  Pt continues very cooperative and progressing well with mobility this pm.  Pt up to ambulate increased distance in hall, reviewed car transfers and exited bed with use of gait belt unassisted.  Stairs deferred at pt request - pt reports will initially stay on main level and work on stairs to bedroom with HHPT.  Pt eager for dc home this date.    If plan is discharge home, recommend the following: A little help with walking and/or transfers;A little help with bathing/dressing/bathroom;Assistance with cooking/housework;Assist for transportation;Help with stairs or ramp for entrance   Can travel by private vehicle        Equipment Recommendations  None recommended by PT    Recommendations for Other Services       Precautions / Restrictions Precautions Precautions: Fall Restrictions Weight Bearing Restrictions Per Provider Order: Yes LLE Weight Bearing Per Provider Order: Weight bearing as tolerated     Mobility  Bed Mobility Overal bed mobility: Needs Assistance Bed Mobility: Supine to Sit     Supine to sit: Supervision     General bed mobility comments: Increased time with cues for sequence and use of R LE to self assist; pt self assisting L LE with gait belt    Transfers Overall transfer level: Needs assistance Equipment used: Rolling walker (2 wheels) Transfers: Sit to/from Stand Sit to Stand: Contact guard assist, Supervision           General transfer comment: min cues for LE management and use of UEs to self assist;    Ambulation/Gait Ambulation/Gait assistance: Contact guard assist, Supervision Gait Distance (Feet): 140 Feet Assistive device: Rolling walker (2 wheels) Gait Pattern/deviations: Decreased step length - right, Decreased  stance time - right, Shuffle, Trunk flexed, Step-to pattern, Step-through pattern Gait velocity: decr     General Gait Details: min cues for sequence, posture and position from [employer]             Wheelchair Mobility     Tilt Bed    Modified Rankin (Stroke Patients Only)       Balance Overall balance assessment: Needs assistance Sitting-balance support: No upper extremity supported, Feet supported Sitting balance-Leahy Scale: Good     Standing balance support: No upper extremity supported Standing balance-Leahy Scale: Fair                              Hotel Manager: No apparent difficulties  Cognition Arousal: Alert Behavior During Therapy: WFL for tasks assessed/performed   PT - Cognitive impairments: No apparent impairments                         Following commands: Intact      Cueing Cueing Techniques: Verbal cues  Exercises Total Joint Exercises Ankle Circles/Pumps: AROM, Both, 15 reps, Supine Quad Sets: AROM, Both, 10 reps, Supine Heel Slides: AAROM, Right, 20 reps, Supine Hip ABduction/ADduction: AAROM, Left, 15 reps, Supine    General Comments        Pertinent Vitals/Pain Pain Assessment Pain Assessment: 0-10 Pain Score: 3  Pain Location: L hip Pain Descriptors / Indicators: Aching, Sore Pain Intervention(s): Limited activity within patient's tolerance, Monitored during session, Premedicated  before session    Home Living                          Prior Function            PT Goals (current goals can now be found in the care plan section) Acute Rehab PT Goals Patient Stated Goal: Regain IND PT Goal Formulation: With patient Time For Goal Achievement: 05/29/24 Potential to Achieve Goals: Good Progress towards PT goals: Progressing toward goals    Frequency    7X/week      PT Plan      Co-evaluation              AM-PAC PT 6 Clicks Mobility    Outcome Measure  Help needed turning from your back to your side while in a flat bed without using bedrails?: A Little Help needed moving from lying on your back to sitting on the side of a flat bed without using bedrails?: A Little Help needed moving to and from a bed to a chair (including a wheelchair)?: A Little Help needed standing up from a chair using your arms (e.g., wheelchair or bedside chair)?: A Little Help needed to walk in hospital room?: A Little Help needed climbing 3-5 steps with a railing? : A Lot 6 Click Score: 17    End of Session Equipment Utilized During Treatment: Gait belt Activity Tolerance: Patient tolerated treatment well Patient left: in chair;with call bell/phone within reach;with chair alarm set Nurse Communication: Mobility status PT Visit Diagnosis: Difficulty in walking, not elsewhere classified (R26.2)     Time: 8659-8645 PT Time Calculation (min) (ACUTE ONLY): 14 min  Charges:    $Gait Training: 8-22 mins $Therapeutic Exercise: 8-22 mins PT General Charges $$ ACUTE PT VISIT: 1 Visit                     [facility] PT Acute Rehabilitation Services Office [phone]    Jaidence Geisler 05/23/2024, 2:25 PM

## 2024-05-24 NOTE — Discharge Summary (Signed)
 Patient ID: Ann Patton MRN: [id number] DOB/AGE: 52/07/1970 52 y.o.  Admit date: 05/22/2024 Discharge date: 05/23/24  Admission Diagnoses:  Principal Problem:   Unilateral primary osteoarthritis, left hip Active Problems:   Status post total replacement of left hip   Discharge Diagnoses:  Same  Past Medical History:  Diagnosis Date   Anemia    Anxiety    Arthritis    Asthma    mild   Breast cancer (HCC)    right breast   Depression    Family history of breast cancer    Family history of leukemia    Family history of lung cancer    Family history of melanoma    Family history of ovarian cancer    Personal history of radiation therapy    Skin cancer     Surgeries: Procedure(s): ARTHROPLASTY, HIP, TOTAL, ANTERIOR APPROACH on 05/22/2024   Consultants:   Discharged Condition: Improved  Hospital Course: Ann Patton is an 53 y.o. female who was admitted 05/22/2024 for operative treatment ofUnilateral primary osteoarthritis, left hip. Patient has severe unremitting pain that affects sleep, daily activities, and work/hobbies. After pre-op clearance the patient was taken to the operating room on 05/22/2024 and underwent  Procedure(s): ARTHROPLASTY, HIP, TOTAL, ANTERIOR APPROACH.    Patient was given perioperative antibiotics:  Anti-infectives (From admission, onward)    Start     Dose/Rate Route Frequency Ordered Stop   05/22/24 1300  ceFAZolin  (ANCEF ) IVPB 2g/100 mL premix        2 g 200 mL/hr over 30 Minutes Intravenous Every 6 hours 05/22/24 1043 05/22/24 1846   05/22/24 0600  ceFAZolin  (ANCEF ) IVPB 2g/100 mL premix        2 g 200 mL/hr over 30 Minutes Intravenous On call to O.R. 05/22/24 0533 05/22/24 9278        Patient was given sequential compression devices, early ambulation, and chemoprophylaxis to prevent DVT.  Inpatient Morphine Milligram Equivalents Per Day 11/7 - 11/8   Values displayed are in units of MME/Day    Order Start / End  Date 11/7 Yesterday    oxyCODONE  (Oxy IR/ROXICODONE ) immediate release tablet 5 mg 11/7 - 11/7 0 of Unknown --    oxyCODONE  (ROXICODONE ) 5 MG/5ML solution 5 mg 11/7 - 11/7 0 of Unknown --      Group total: 0 of Unknown     fentaNYL  (SUBLIMAZE ) injection 25-50 mcg 11/7 - 11/7 15 of 45-90 --    HYDROmorphone  (DILAUDID ) injection 0.5-1 mg 11/7 - 11/8 60 of 40-80 40 of 50-100    oxyCODONE  (Oxy IR/ROXICODONE ) immediate release tablet 5-10 mg 11/7 - 11/8 22.5 of 30-60 30 of 37.5-75    oxyCODONE  (Oxy IR/ROXICODONE ) immediate release tablet 10-15 mg 11/7 - 11/8 37.5 of 60-90 45 of 75-112.5    Daily Totals  135 of Unknown (at least 175-320) 115 of 162.5-287.5    Calculation Errors     Order Type Date Details   oxyCODONE  (Oxy IR/ROXICODONE ) immediate release tablet 5 mg Ordered Dose -- Insufficient frequency information   oxyCODONE  (ROXICODONE ) 5 MG/5ML solution 5 mg Ordered Dose -- Insufficient frequency information            Patient benefited maximally from hospital stay and there were no complications.    Recent vital signs: No data found.   Recent laboratory studies:  Recent Labs    05/23/24 0339  WBC 7.3  HGB 8.8*  HCT 28.1*  PLT 308  NA 136  K 4.2  CL  102  CO2 25  BUN 10  CREATININE 0.79  GLUCOSE 122*  CALCIUM 8.4*     Discharge Medications:   Allergies as of 05/23/2024   No Known Allergies      Medication List     TAKE these medications    albuterol 108 (90 Base) MCG/ACT inhaler Commonly known as: VENTOLIN HFA Inhale 1-2 puffs into the lungs every 6 (six) hours as needed for wheezing or shortness of breath.   aspirin  81 MG chewable tablet Chew 1 tablet (81 mg total) by mouth 2 (two) times daily.   desvenlafaxine 50 MG 24 hr tablet Commonly known as: PRISTIQ Take 50 mg by mouth at bedtime.   diclofenac  75 MG EC tablet Commonly known as: VOLTAREN  TAKE 1 TABLET BY MOUTH 2 TIMES DAILY AS NEEDED. What changed: See the new instructions.   ferrous sulfate  324 MG Tbec Take 324 mg by mouth in the morning.   gabapentin  100 MG capsule Commonly known as: NEURONTIN  Take 1 capsule (100 mg total) by mouth 3 (three) times daily as needed (for burning-type pain).   oxyCODONE  5 MG immediate release tablet Commonly known as: Oxy IR/ROXICODONE  Take 1-2 tablets (5-10 mg total) by mouth every 4 (four) hours as needed for moderate pain (pain score 4-6) (pain score 4-6).   tiZANidine 4 MG tablet Commonly known as: ZANAFLEX Take 1 tablet (4 mg total) by mouth every 6 (six) hours as needed for muscle spasms.        Diagnostic Studies: DG Pelvis Portable Result Date: 05/22/2024 CLINICAL DATA:  Status post left hip replacement. EXAM: PORTABLE PELVIS 1-2 VIEWS COMPARISON:  None Available. FINDINGS: Left hip arthroplasty in expected alignment. No periprosthetic lucency or fracture. Recent postsurgical change includes air and edema in the soft tissues. Overlying skin staples in place. Previous right hip arthroplasty. IMPRESSION: Left hip arthroplasty without immediate postoperative complication. Electronically Signed   By: Andrea Gasman M.D.   On: 05/22/2024 09:37   DG HIP UNILAT WITH PELVIS 1V LEFT Result Date: 05/22/2024 CLINICAL DATA:  Elective surgery. EXAM: DG HIP (WITH OR WITHOUT PELVIS) 1V*L* COMPARISON:  10/07/2023 FINDINGS: Three fluoroscopic spot views of the pelvis and left hip obtained in the operating room. Images during hip arthroplasty. Previous right hip arthroplasty. Fluoroscopy time 13 seconds. Dose 2 mGy. IMPRESSION: Intraoperative fluoroscopy during left hip arthroplasty. Electronically Signed   By: Andrea Gasman M.D.   On: 05/22/2024 09:37   DG C-Arm 1-60 Min-No Report Result Date: 05/22/2024 Fluoroscopy was utilized by the requesting physician.  No radiographic interpretation.    Disposition: Discharge disposition: 01-Home or Self Care          Follow-up Information     Vernetta Lonni GRADE, MD Follow up in 2 week(s).    Specialty: Orthopedic Surgery Contact information: [address] [place]  [place] [address] [phone]                  Signed: Lonni GRADE Vernetta 05/24/2024, 5:16 PM

## 2024-05-25 ENCOUNTER — Encounter (HOSPITAL_COMMUNITY): Payer: Self-pay | Admitting: Orthopaedic Surgery

## 2024-05-26 ENCOUNTER — Other Ambulatory Visit: Payer: Self-pay | Admitting: Orthopaedic Surgery

## 2024-05-26 ENCOUNTER — Telehealth: Payer: Self-pay

## 2024-05-26 MED ORDER — OXYCODONE HCL 5 MG PO TABS: 5.0000 mg | ORAL_TABLET | Freq: Four times a day (QID) | ORAL | 0 refills | Status: DC | PRN

## 2024-05-26 NOTE — Telephone Encounter (Signed)
 Patient left voice mail on triage phone.  She is requesting refill of Oxycodone  IR 5 mg sent to [facility].  She is still experiencing pain at 7-10.

## 2024-06-04 ENCOUNTER — Ambulatory Visit (INDEPENDENT_AMBULATORY_CARE_PROVIDER_SITE_OTHER): Admitting: Orthopaedic Surgery

## 2024-06-04 ENCOUNTER — Encounter: Payer: Self-pay | Admitting: Orthopaedic Surgery

## 2024-06-04 DIAGNOSIS — Z96642 Presence of left artificial hip joint: Secondary | ICD-10-CM

## 2024-06-04 MED ORDER — OXYCODONE HCL 5 MG PO TABS: 5.0000 mg | ORAL_TABLET | Freq: Four times a day (QID) | ORAL | 0 refills | Status: AC | PRN

## 2024-06-04 MED ORDER — TIZANIDINE HCL 4 MG PO TABS: 4.0000 mg | ORAL_TABLET | Freq: Four times a day (QID) | ORAL | 1 refills | Status: AC | PRN

## 2024-06-04 NOTE — Progress Notes (Signed)
 The patient is here today for her first postoperative visit status post a left total hip replacement.  This was done 2 weeks ago.  We replaced her right hip almost a year ago.  These were both done through a direct anterior approach.  She is a young 53 years old but unfortunately had significant arthritis in both of her hips.  She has been compliant with a baby aspirin  twice daily.  She does report needing a refill on oxycodone  and the muscle relaxant.  Have also let her know that she can stop the baby aspirin  twice daily.  She has been on diclofenac  in the past and has it at home and she should start this to help with the pain it is) inflammation postop.  Her left hip incision looks good.  Staples removed and Steri-Strips applied.  I believe her leg lengths are equal as well with her laying supine.  She can drive from our standpoint.  She will slowly increase her activities as comfort allows.  She knows not to drive with a narcotic in her system.  We will see her back in a month to see how she is doing overall but no x-rays are needed.

## 2024-07-02 ENCOUNTER — Ambulatory Visit: Admitting: Orthopaedic Surgery

## 2024-07-02 DIAGNOSIS — Z96642 Presence of left artificial hip joint: Secondary | ICD-10-CM

## 2024-07-02 NOTE — Progress Notes (Signed)
 Ann Patton is an incredibly active 53 year old female who is here at 6 weeks status post a left total hip replacement.  We replaced her right hip in December of last year.  She is doing wonderful and looks great overall.  Her gait appears normal.  Her leg lengths are equal.  Both hips move smoothly and fluidly.  We did talk about the likelihood that this is more genetics related in terms of her osteoarthritis.  She has no other issues with her other joints thus far.  She has no restrictions at this point will increase activities as comfort allows.  Will see her back in 6 months and we will see her for just an AP pelvis at that visit.

## 2024-12-24 ENCOUNTER — Ambulatory Visit: Admitting: Orthopaedic Surgery
